# Patient Record
Sex: Male | Born: 1970 | Race: White | Hispanic: No | Marital: Married | Smoking: Former smoker
Health system: Southern US, Community
[De-identification: ages and names within clinical notes are randomized; demographics above are authoritative.]

## PROBLEM LIST (undated history)

## (undated) DIAGNOSIS — Z9889 Other specified postprocedural states: Secondary | ICD-10-CM

## (undated) DIAGNOSIS — E7800 Pure hypercholesterolemia, unspecified: Secondary | ICD-10-CM

## (undated) DIAGNOSIS — R112 Nausea with vomiting, unspecified: Secondary | ICD-10-CM

## (undated) HISTORY — DX: Pure hypercholesterolemia, unspecified: E78.00

---

## 2006-04-12 ENCOUNTER — Emergency Department: Payer: Self-pay | Admitting: Emergency Medicine

## 2009-02-02 ENCOUNTER — Emergency Department: Payer: Self-pay | Admitting: Emergency Medicine

## 2010-05-22 HISTORY — PX: FOOT SURGERY: SHX648

## 2011-01-10 ENCOUNTER — Inpatient Hospital Stay: Payer: Self-pay | Admitting: Internal Medicine

## 2011-02-07 ENCOUNTER — Other Ambulatory Visit: Payer: Self-pay | Admitting: Podiatry

## 2016-04-27 ENCOUNTER — Encounter: Payer: Self-pay | Admitting: Internal Medicine

## 2016-04-27 ENCOUNTER — Ambulatory Visit (INDEPENDENT_AMBULATORY_CARE_PROVIDER_SITE_OTHER): Payer: 59 | Admitting: Internal Medicine

## 2016-04-27 VITALS — BP 118/80 | HR 56 | Temp 97.8°F | Ht 75.0 in | Wt 196.0 lb

## 2016-04-27 DIAGNOSIS — Z0001 Encounter for general adult medical examination with abnormal findings: Secondary | ICD-10-CM | POA: Diagnosis not present

## 2016-04-27 DIAGNOSIS — M25541 Pain in joints of right hand: Secondary | ICD-10-CM | POA: Diagnosis not present

## 2016-04-27 LAB — COMPREHENSIVE METABOLIC PANEL
ALT: 27 U/L (ref 0–53)
AST: 24 U/L (ref 0–37)
Albumin: 4.6 g/dL (ref 3.5–5.2)
Alkaline Phosphatase: 68 U/L (ref 39–117)
BILIRUBIN TOTAL: 0.6 mg/dL (ref 0.2–1.2)
BUN: 16 mg/dL (ref 6–23)
CALCIUM: 9.7 mg/dL (ref 8.4–10.5)
CHLORIDE: 102 meq/L (ref 96–112)
CO2: 31 meq/L (ref 19–32)
Creatinine, Ser: 1.14 mg/dL (ref 0.40–1.50)
GFR: 72.95 mL/min (ref >60.00)
GLUCOSE: 93 mg/dL (ref 70–99)
POTASSIUM: 5.1 meq/L (ref 3.5–5.1)
Sodium: 138 mEq/L (ref 135–145)
Total Protein: 7.8 g/dL (ref 6.0–8.3)

## 2016-04-27 LAB — CBC
HEMATOCRIT: 43.2 % (ref 39.0–52.0)
HEMOGLOBIN: 14.4 g/dL (ref 13.0–17.0)
MCHC: 33.5 g/dL (ref 30.0–36.0)
MCV: 93.1 fl (ref 78.0–100.0)
PLATELETS: 264 10*3/uL (ref 150.0–400.0)
RBC: 4.64 Mil/uL (ref 4.22–5.81)
RDW: 12.7 % (ref 11.5–15.5)
WBC: 6 10*3/uL (ref 4.0–10.5)

## 2016-04-27 LAB — LIPID PANEL
CHOL/HDL RATIO: 4
Cholesterol: 228 mg/dL — ABNORMAL HIGH (ref 0–200)
HDL: 52.1 mg/dL (ref >39.00)
LDL Cholesterol: 160 mg/dL — ABNORMAL HIGH (ref 0–99)
NONHDL: 175.64
TRIGLYCERIDES: 77 mg/dL (ref 0.0–149.0)
VLDL: 15.4 mg/dL (ref 0.0–40.0)

## 2016-04-27 NOTE — Progress Notes (Signed)
HPI  Pt presents to the clinic today to establish care. He has not had a PCP in many years.  Flu: never Tetanus: 2012 Vision Screening: as needed Dentist: biannually  Diet: He does eat meat. He consumes fruits and veggies some days. He does eat fried foods. He drinks mostly soda. Exercise: None  No past medical history on file.  No current outpatient prescriptions on file.   No current facility-administered medications for this visit.     No Known Allergies  Family History  Problem Relation Age of Onset  . Stroke Maternal Grandmother   . Stroke Paternal Grandmother     Social History   Social History  . Marital status: Married    Spouse name: N/A  . Number of children: N/A  . Years of education: N/A   Occupational History  . Not on file.   Social History Main Topics  . Smoking status: Never Smoker  . Smokeless tobacco: Never Used  . Alcohol use 2.4 - 3.6 oz/week    4 - 6 Cans of beer per week     Comment: occasional--moderate  . Drug use: No  . Sexual activity: Not on file   Other Topics Concern  . Not on file   Social History Narrative  . No narrative on file    ROS:  Constitutional: Denies fever, malaise, fatigue, headache or abrupt weight changes.  HEENT: Denies eye pain, eye redness, ear pain, ringing in the ears, wax buildup, runny nose, nasal congestion, bloody nose, or sore throat. Respiratory: Denies difficulty breathing, shortness of breath, cough or sputum production.   Cardiovascular: Denies chest pain, chest tightness, palpitations or swelling in the hands or feet.  Gastrointestinal: Denies abdominal pain, bloating, constipation, diarrhea or blood in the stool.  GU: Denies frequency, urgency, pain with urination, blood in urine, odor or discharge. Musculoskeletal: Pt reports right thumb pain. Denies decrease in range of motion, difficulty with gait, muscle pain or jointswelling.  Skin: Denies redness, rashes, lesions or ulcercations.   Neurological: Denies dizziness, difficulty with memory, difficulty with speech or problems with balance and coordination.  Psych: Denies anxiety, depression, SI/HI.  No other specific complaints in a complete review of systems (except as listed in HPI above).  PE:  BP 118/80   Pulse (!) 56   Temp 97.8 F (36.6 C) (Oral)   Ht 6\' 3"  (1.905 m)   Wt 196 lb (88.9 kg)   SpO2 98%   BMI 24.50 kg/m  Wt Readings from Last 3 Encounters:  04/27/16 196 lb (88.9 kg)    General: Appears his stated age, well developed, well nourished in NAD. HEENT: Head: normal shape and size; Eyes: sclera white, no icterus, conjunctiva pink, PERRLA and EOMs intact; Ears: Tm's gray and intact, normal light reflex;Throat/Mouth: Teeth present, mucosa pink and moist, no lesions or ulcerations noted.  Neck: Neck supple, trachea midline. No masses, lumps or thyromegaly present.  Cardiovascular: Normal rate and rhythm. S1,S2 noted.  No murmur, rubs or gallops noted. No JVD or BLE edema. No carotid bruits noted. Pulmonary/Chest: Normal effort and positive vesicular breath sounds. No respiratory distress. No wheezes, rales or ronchi noted.  Abdomen: Soft and nontender. Normal bowel sounds, no bruits noted. No distention or masses noted. Liver, spleen and kidneys non palpable. Musculoskeletal: Normal flexion and extension of the right thumb. No signs of joint swelling. Right thumb MCP enlarged and tender with palpation. Strength 5/5 BLE. No difficulty with gait.  Neurological: Alert and oriented. Cranial nerves II-XII grossly  intact. Coordination normal.  Psychiatric: Mood and affect normal. Behavior is normal. Judgment and thought content normal.     Assessment and Plan:  Preventative Health Maintenance:  He declines flu shot today Tetanus UTD Encouraged him to consume a balanced diet and exercise regimen Advised him to see an eye doctor and dentist annually Will check CBC, CMET, Lipid today  Right thumb  pain:  Likely arthritis Advised him to take Aleve or Ibuprofen OTC  RTC in 1 year, sooner if needed Webb Silversmith, NP

## 2016-04-27 NOTE — Patient Instructions (Signed)

## 2016-10-13 DIAGNOSIS — M7702 Medial epicondylitis, left elbow: Secondary | ICD-10-CM | POA: Diagnosis not present

## 2017-11-15 ENCOUNTER — Encounter: Payer: Self-pay | Admitting: Family Medicine

## 2017-11-15 ENCOUNTER — Ambulatory Visit (INDEPENDENT_AMBULATORY_CARE_PROVIDER_SITE_OTHER): Payer: Self-pay | Admitting: Family Medicine

## 2017-11-15 VITALS — BP 110/80 | HR 74 | Temp 98.4°F | Ht 76.0 in | Wt 198.0 lb

## 2017-11-15 DIAGNOSIS — Z0000 Encounter for general adult medical examination without abnormal findings: Secondary | ICD-10-CM

## 2017-11-15 LAB — POCT URINALYSIS DIPSTICK
BILIRUBIN UA: NEGATIVE
Blood, UA: NEGATIVE
GLUCOSE UA: NEGATIVE
Ketones, UA: NEGATIVE
Leukocytes, UA: NEGATIVE
Nitrite, UA: NEGATIVE
Protein, UA: NEGATIVE
Spec Grav, UA: >=1.03 — AB (ref 1.010–1.025)
Urobilinogen, UA: 0.2 E.U./dL
pH, UA: 5 (ref 5.0–8.0)

## 2017-11-15 NOTE — Progress Notes (Signed)
Patient ID: Kristopher Osborne, male    DOB: 02/23/1971, 47 y.o.   MRN: [id number]  PCP: Jearld Fenton, NP  Chief Complaint  Patient presents with  . Wellness Visit    Subjective:  HPI  Kristopher Osborne is a 47 y.o. male presents for wellness visit in order to satisfy employee sponsored health insurance benefits. No recent follow-up with a primary care provider. He was last seen in 2017 for physical in which his cholesterol was elevated and his provider wanted to initiate statin therapy, although he declined. Admits to poor eating choices. He is a non-smoker. He works as an Database administrator and reports that he lives a very physically active lifestyle. He denies activity intolerance, shortness of breath, chest pain, dizziness, or new weakness.  Social History   Socioeconomic History  . Marital status: Married    Spouse name: Not on file  . Number of children: Not on file  . Years of education: Not on file  . Highest education level: Not on file  Occupational History  . Not on file  Social Needs  . Financial resource strain: Not on file  . Food insecurity:    Worry: Not on file    Inability: Not on file  . Transportation needs:    Medical: Not on file    Non-medical: Not on file  Tobacco Use  . Smoking status: Never Smoker  . Smokeless tobacco: Never Used  Substance and Sexual Activity  . Alcohol use: Yes    Alcohol/week: 2.4 - 3.6 oz    Types: 4 - 6 Cans of beer per week    Comment: occasional--moderate  . Drug use: No  . Sexual activity: Yes  Lifestyle  . Physical activity:    Days per week: Not on file    Minutes per session: Not on file  . Stress: Not on file  Relationships  . Social connections:    Talks on phone: Not on file    Gets together: Not on file    Attends religious service: Not on file    Active member of club or organization: Not on file    Attends meetings of clubs or organizations: Not on file    Relationship status: Not on file  .  Intimate partner violence:    Fear of current or ex partner: Not on file    Emotionally abused: Not on file    Physically abused: Not on file    Forced sexual activity: Not on file  Other Topics Concern  . Not on file  Social History Narrative  . Not on file    Family History  Problem Relation Age of Onset  . Stroke Maternal Grandmother   . Stroke Paternal Grandmother     Review of Systems Constitutional: Negative for fever, chills, diaphoresis, activity change, appetite change and fatigue. HENT: Negative for ear pain, nosebleeds, congestion, facial swelling, rhinorrhea, neck pain, neck stiffness and ear discharge.  Eyes: Negative for pain, discharge, redness, itching and visual disturbance. Respiratory: Negative for cough, choking, chest tightness, shortness of breath, wheezing and stridor.  Cardiovascular: Negative for chest pain, palpitations and leg swelling. Gastrointestinal: Negative for abdominal distention. Musculoskeletal: Negative for back pain, joint swelling, arthralgia and gait problem. Neurological: Negative for dizziness, tremors, seizures, syncope, facial asymmetry, speech difficulty, weakness, light-headedness, numbness and headaches.   No Known Allergies  Prior to Admission medications   Not on File   Past Medical, Surgical Family and Social History reviewed and updated.  Objective:   Today's Vitals   11/15/17 1058  BP: 110/80  Pulse: 74  Temp: 98.4 F (36.9 C)  SpO2: 96%  Weight: 198 lb (89.8 kg)  Height: 6\' 4"  (1.93 m)    Wt Readings from Last 3 Encounters:  11/15/17 198 lb (89.8 kg)  04/27/16 196 lb (88.9 kg)     Hearing Screening   125Hz  250Hz  500Hz  1000Hz  2000Hz  3000Hz  4000Hz  6000Hz  8000Hz   Right ear:   Pass Pass Pass  Fail    Left ear:   Pass Pass Pass  Fail      Visual Acuity Screening   Right eye Left eye Both eyes  Without correction: 20/20 20/25 20/20   With correction:      Physical Exam Constitutional: Patient appears  well-developed and well-nourished. No distress. HENT: Normocephalic, atraumatic, External right and left ear normal. Oropharynx is clear and moist.  Eyes: Conjunctivae and EOM are normal. PERRLA, no scleral icterus. Neck: Normal ROM. Neck supple. No JVD. No tracheal deviation. No thyromegaly. CVS: RRR, S1/S2 +, no murmurs, no gallops, no carotid bruit.  Pulmonary: Effort and breath sounds normal, no stridor, rhonchi, wheezes, rales.  Abdominal: Soft. BS +, no distension, tenderness, rebound or guarding.  Musculoskeletal: Normal range of motion. No edema and no tenderness.  Neuro: Alert. Normal reflexes, muscle tone coordination. No cranial nerve deficit. Skin: Skin is warm and dry. No rash noted. Not diaphoretic. No erythema. No pallor.   Assessment & Plan:  1. Wellness examination Continue routine physical activity as tolerated with a goal of 150 minutes per week. Improve dietary choices and eat a meal regimen consistent with a Mediterranean or DASH diet. Reduce simple carbohydrates.  Follow-up with PCP to obtain routine screening labs and repeat lipid panel. -POCT Urinalysis Dipstick, unremarkable   If symptoms worsen or do not improve, return for follow-up, follow-up with PCP, or at the emergency department if severity of symptoms warrant a higher level of care.     Carroll Sage. Kenton Kingfisher, MSN, FNP-C [facility]  [address] [place], [place] [address] [phone]

## 2017-11-15 NOTE — Patient Instructions (Addendum)
Follow-up with your PCP to have cholesterol rechecked and any other recommended labs.   Health Maintenance, Male A healthy lifestyle and preventive care is important for your health and wellness. Ask your health care provider about what schedule of regular examinations is right for you. What should I know about weight and diet? Eat a Healthy Diet  Eat plenty of vegetables, fruits, whole grains, low-fat dairy products, and lean protein.  Do not eat a lot of foods high in solid fats, added sugars, or salt.  Maintain a Healthy Weight Regular exercise can help you achieve or maintain a healthy weight. You should:  Do at least 150 minutes of exercise each week. The exercise should increase your heart rate and make you sweat (moderate-intensity exercise).  Do strength-training exercises at least twice a week.  Watch Your Levels of Cholesterol and Blood Lipids  Have your blood tested for lipids and cholesterol every 5 years starting at 49 years of age. If you are at high risk for heart disease, you should start having your blood tested when you are 49 years old. You may need to have your cholesterol levels checked more often if: ? Your lipid or cholesterol levels are high. ? You are older than 49 years of age. ? You are at high risk for heart disease.  What should I know about cancer screening? Many types of cancers can be detected early and may often be prevented. Lung Cancer  You should be screened every year for lung cancer if: ? You are a current smoker who has smoked for at least 30 years. ? You are a former smoker who has quit within the past 15 years.  Talk to your health care provider about your screening options, when you should start screening, and how often you should be screened.  Colorectal Cancer  Routine colorectal cancer screening usually begins at 49 years of age and should be repeated every 5-10 years until you are 49 years old. You may need to be screened more often  if early forms of precancerous polyps or small growths are found. Your health care provider may recommend screening at an earlier age if you have risk factors for colon cancer.  Your health care provider may recommend using home test kits to check for hidden blood in the stool.  A small camera at the end of a tube can be used to examine your colon (sigmoidoscopy or colonoscopy). This checks for the earliest forms of colorectal cancer.  Prostate and Testicular Cancer  Depending on your age and overall health, your health care provider may do certain tests to screen for prostate and testicular cancer.  Talk to your health care provider about any symptoms or concerns you have about testicular or prostate cancer.  Skin Cancer  Check your skin from head to toe regularly.  Tell your health care provider about any new moles or changes in moles, especially if: ? There is a change in a mole's size, shape, or color. ? You have a mole that is larger than a pencil eraser.  Always use sunscreen. Apply sunscreen liberally and repeat throughout the day.  Protect yourself by wearing long sleeves, pants, a wide-brimmed hat, and sunglasses when outside.  What should I know about heart disease, diabetes, and high blood pressure?  If you are 55-15 years of age, have your blood pressure checked every 3-5 years. If you are 88 years of age or older, have your blood pressure checked every year. You should have your  blood pressure measured twice-once when you are at a hospital or clinic, and once when you are not at a hospital or clinic. Record the average of the two measurements. To check your blood pressure when you are not at a hospital or clinic, you can use: ? An automated blood pressure machine at a pharmacy. ? A home blood pressure monitor.  Talk to your health care provider about your target blood pressure.  If you are between 87-20 years old, ask your health care provider if you should take aspirin  to prevent heart disease.  Have regular diabetes screenings by checking your fasting blood sugar level. ? If you are at a normal weight and have a low risk for diabetes, have this test once every three years after the age of 12. ? If you are overweight and have a high risk for diabetes, consider being tested at a younger age or more often.  A one-time screening for abdominal aortic aneurysm (AAA) by ultrasound is recommended for men aged 33-75 years who are current or former smokers. What should I know about preventing infection? Hepatitis B If you have a higher risk for hepatitis B, you should be screened for this virus. Talk with your health care provider to find out if you are at risk for hepatitis B infection. Hepatitis C Blood testing is recommended for:  Everyone born from [birth year] through 1967.  Anyone with known risk factors for hepatitis C.  Sexually Transmitted Diseases (STDs)  You should be screened each year for STDs including gonorrhea and chlamydia if: ? You are sexually active and are younger than 49 years of age. ? You are older than 49 years of age and your health care provider tells you that you are at risk for this type of infection. ? Your sexual activity has changed since you were last screened and you are at an increased risk for chlamydia or gonorrhea. Ask your health care provider if you are at risk.  Talk with your health care provider about whether you are at high risk of being infected with HIV. Your health care provider may recommend a prescription medicine to help prevent HIV infection.  What else can I do?  Schedule regular health, dental, and eye exams.  Stay current with your vaccines (immunizations).  Do not use any tobacco products, such as cigarettes, chewing tobacco, and e-cigarettes. If you need help quitting, ask your health care provider.  Limit alcohol intake to no more than 2 drinks per day. One drink equals 12 ounces of beer, 5 ounces of wine,  or 1 ounces of hard liquor.  Do not use street drugs.  Do not share needles.  Ask your health care provider for help if you need support or information about quitting drugs.  Tell your health care provider if you often feel depressed.  Tell your health care provider if you have ever been abused or do not feel safe at home. This information is not intended to replace advice given to you by your health care provider. Make sure you discuss any questions you have with your health care provider. Document Released: 11/04/2007 Document Revised: 01/05/2016 Document Reviewed: 02/09/2015 Elsevier Interactive Patient Education  2018 [employer].    Cholesterol Cholesterol is a fat. Your body needs a small amount of cholesterol. Cholesterol (plaque) may build up in your blood vessels (arteries). That makes you more likely to have a heart attack or stroke. You cannot feel your cholesterol level. Having a blood test is the  only way to find out if your level is high. Keep your test results. Work with your doctor to keep your cholesterol at a good level. What do the results mean? Total cholesterol is how much cholesterol is in your blood. LDL is bad cholesterol. This is the type that can build up. Try to have low LDL. HDL is good cholesterol. It cleans your blood vessels and carries LDL away. Try to have high HDL. Triglycerides are fat that the body can store or burn for energy. What are good levels of cholesterol? Total cholesterol below 200. LDL below 100 is good for people who have health risks. LDL below 70 is good for people who have very high risks. HDL above 40 is good. It is best to have HDL of 60 or higher. Triglycerides below 150. How can I lower my cholesterol? Diet Follow your diet program as told by your doctor. Choose fish, white meat chicken, or [place] that is roasted or baked. Try not to eat red meat, fried foods, sausage, or lunch meats. Eat lots of fresh fruits and  vegetables. Choose whole grains, beans, pasta, potatoes, and cereals. Choose olive oil, corn oil, or canola oil. Only use small amounts. Try not to eat butter, mayonnaise, shortening, or palm kernel oils. Try not to eat foods with trans fats. Choose low-fat or nonfat dairy foods. Drink skim or nonfat milk. Eat low-fat or nonfat yogurt and cheeses. Try not to drink whole milk or cream. Try not to eat ice cream, egg yolks, or full-fat cheeses. Healthy desserts include angel food cake, ginger snaps, animal crackers, hard candy, popsicles, and low-fat or nonfat frozen yogurt. Try not to eat pastries, cakes, pies, and cookies.  Exercise Follow your exercise program as told by your doctor. Be more active. Try gardening, walking, and taking the stairs. Ask your doctor about ways that you can be more active.  Medicine Take over-the-counter and prescription medicines only as told by your doctor. This information is not intended to replace advice given to you by your health care provider. Make sure you discuss any questions you have with your health care provider. Document Released: 08/04/2008 Document Revised: 12/08/2015 Document Reviewed: 11/18/2015 Elsevier Interactive Patient Education  [employer].

## 2017-12-19 ENCOUNTER — Encounter: Payer: Self-pay | Admitting: Family Medicine

## 2017-12-19 ENCOUNTER — Ambulatory Visit (INDEPENDENT_AMBULATORY_CARE_PROVIDER_SITE_OTHER): Payer: No Typology Code available for payment source | Admitting: Family Medicine

## 2017-12-19 VITALS — BP 104/68 | HR 63 | Temp 97.7°F | Resp 16 | Ht 76.0 in | Wt 200.3 lb

## 2017-12-19 DIAGNOSIS — R109 Unspecified abdominal pain: Secondary | ICD-10-CM

## 2017-12-19 DIAGNOSIS — E7800 Pure hypercholesterolemia, unspecified: Secondary | ICD-10-CM | POA: Diagnosis not present

## 2017-12-19 DIAGNOSIS — L989 Disorder of the skin and subcutaneous tissue, unspecified: Secondary | ICD-10-CM

## 2017-12-19 DIAGNOSIS — Z131 Encounter for screening for diabetes mellitus: Secondary | ICD-10-CM

## 2017-12-19 DIAGNOSIS — Z23 Encounter for immunization: Secondary | ICD-10-CM | POA: Diagnosis not present

## 2017-12-19 DIAGNOSIS — Z205 Contact with and (suspected) exposure to viral hepatitis: Secondary | ICD-10-CM | POA: Diagnosis not present

## 2017-12-19 DIAGNOSIS — E785 Hyperlipidemia, unspecified: Secondary | ICD-10-CM | POA: Insufficient documentation

## 2017-12-19 MED ORDER — NAPROXEN 500 MG PO TABS: 500.0000 mg | ORAL_TABLET | Freq: Two times a day (BID) | ORAL | 0 refills | Status: DC

## 2017-12-19 NOTE — Progress Notes (Signed)
Name: Kristopher Osborne   MRN: [id number]    DOB: 1970-09-15   Date:12/19/2017       Progress Note  Subjective  Chief Complaint  Chief Complaint  Patient presents with  . Establish Care    HPI  Hypercholesteremia: last labs done in 2017 and LDL was 160. He has family history of strokes but no early onset heart disease. He denies chest pain, palpitation, or elevated bp  Skin lesion: he is bald and has noticed a spot that is rough and he keeps scratching, he is bald and likes to be in the sun.   Left flank pain: he states he noticed left flank pain for the past 6 months, he states no weight change, no change in bowel movements, bristol scale of 3, no dysuria or hematuria. No history of kidney stones. Normal appetite.   Exposure to hepatitis C: we will check labs as requested    Patient Active Problem List   Diagnosis Date Noted  . Hyperlipidemia 12/19/2017    Past Surgical History:  Procedure Laterality Date  . FOOT SURGERY Left 2012    Family History  Problem Relation Age of Onset  . Hepatitis C Father   . Stroke Maternal Grandmother   . Stroke Paternal Grandmother     Social History   Socioeconomic History  . Marital status: Married    Spouse name: Helene Kelp   . Number of children: 1  . Years of education: Not on file  . Highest education level: GED or equivalent  Occupational History  . Occupation: Dealer   Social Needs  . Financial resource strain: Not hard at all  . Food insecurity:    Worry: Never true    Inability: Never true  . Transportation needs:    Medical: No    Non-medical: No  Tobacco Use  . Smoking status: Former Smoker    Packs/day: 0.50    Years: 7.00    Pack years: 3.50    Types: Cigarettes    Start date: 05/23/1987    Last attempt to quit: 12/20/1994    Years since quitting: 23.0  . Smokeless tobacco: Never Used  Substance and Sexual Activity  . Alcohol use: Yes    Alcohol/week: 2.4 - 3.6 oz    Types: 4 - 6 Cans of beer per week   Comment: occasional--moderate  . Drug use: No  . Sexual activity: Yes    Partners: Female  Lifestyle  . Physical activity:    Days per week: 2 days    Minutes per session: 60 min  . Stress: Not at all  Relationships  . Social connections:    Talks on phone: Three times a week    Gets together: Twice a week    Attends religious service: Never    Active member of club or organization: No    Attends meetings of clubs or organizations: Never    Relationship status: Married  . Intimate partner violence:    Fear of current or ex partner: No    Emotionally abused: No    Physically abused: No    Forced sexual activity: No  Other Topics Concern  . Not on file  Social History Narrative  . Not on file     Current Outpatient Medications:  .  calcium carbonate (OS-CAL - DOSED IN MG OF ELEMENTAL CALCIUM) 1250 (500 Ca) MG tablet, Take 1 tablet by mouth daily with breakfast., Disp: , Rfl:  .  vitamin B-12 (CYANOCOBALAMIN) 500 MCG tablet, Take  500 mcg by mouth daily., Disp: , Rfl:   No Known Allergies   ROS  Constitutional: Negative for fever or weight change.  Respiratory: Negative for cough and shortness of breath.   Cardiovascular: Negative for chest pain or palpitations.  Gastrointestinal: positive for abdominal pain, no bowel changes.  Musculoskeletal: Negative for gait problem or joint swelling.  Skin: Negative for rash.  Neurological: Negative for dizziness or headache.  No other specific complaints in a complete review of systems (except as listed in HPI above).  Objective  Vitals:   12/19/17 0849  BP: 104/68  Pulse: 63  Resp: 16  Temp: 97.7 F (36.5 C)  TempSrc: Oral  SpO2: 97%  Weight: 200 lb 4.8 oz (90.9 kg)  Height: 6\' 4"  (1.93 m)    Body mass index is 24.38 kg/m.  Physical Exam  Constitutional: Patient appears well-developed and well-nourished.  No distress.  HEENT: head atraumatic, normocephalic, pupils equal and reactive to light,  neck supple, throat  within normal limits Cardiovascular: Normal rate, regular rhythm and normal heart sounds.  No murmur heard. No BLE edema. Pulmonary/Chest: Effort normal and breath sounds normal. No respiratory distress. Abdominal: Soft.  There is no tenderness. Negative CVA tenderness, spleen normal, normal bowel sounds  Muscular Skeletal: normal rom of spine, no pain during palpation of his back.  Psychiatric: Patient has a normal mood and affect. behavior is normal. Judgment and thought content normal.  Recent Results (from the past 2160 hour(s))  POCT Urinalysis Dipstick     Status: Abnormal   Collection Time: 11/15/17 11:14 AM  Result Value Ref Range   Color, UA yellow    Clarity, UA clear    Glucose, UA Negative Negative   Bilirubin, UA neg    Ketones, UA neg    Spec Grav, UA >=1.030 (A) 1.010 - 1.025   Blood, UA neg    pH, UA 5.0 5.0 - 8.0   Protein, UA Negative Negative   Urobilinogen, UA 0.2 0.2 or 1.0 E.U./dL   Nitrite, UA neg    Leukocytes, UA Negative Negative   Appearance     Odor       PHQ2/9: Depression screen [facility] 2/9 12/19/2017 04/27/2016  Decreased Interest 0 0  Down, Depressed, Hopeless 0 0  PHQ - 2 Score 0 0  Altered sleeping 0 -  Tired, decreased energy 0 -  Change in appetite 0 -  Feeling bad or failure about yourself  0 -  Trouble concentrating 0 -  Moving slowly or fidgety/restless 0 -  Suicidal thoughts 0 -  PHQ-9 Score 0 -  Difficult doing work/chores Not difficult at all -     Fall Risk: Fall Risk  12/19/2017 04/27/2016  Falls in the past year? No No     Functional Status Survey: Is the patient deaf or have difficulty hearing?: No Does the patient have difficulty seeing, even when wearing glasses/contacts?: No Does the patient have difficulty concentrating, remembering, or making decisions?: No Does the patient have difficulty walking or climbing stairs?: No Does the patient have difficulty dressing or bathing?: No Does the patient have difficulty doing  errands alone such as visiting a doctor's office or shopping?: No    Assessment & Plan  1. Pure hypercholesterolemia  - Lipid panel - EKG 12-Lead  2. Need for Tdap vaccination  - Tdap vaccine greater than or equal to 7yo IM  3. Diabetes mellitus screening  - COMPLETE METABOLIC PANEL WITH GFR - Hemoglobin A1c  4. Exposure to  hepatitis C  - COMPLETE METABOLIC PANEL WITH GFR - CBC with Differential/Platelet - Hepatitis panel, acute  5. Skin lesion of scalp  - Ambulatory referral to Dermatology  6. Left flank pain  Seems to be muscular, advised nsaid's for one week and stop

## 2017-12-20 LAB — CBC WITH DIFFERENTIAL/PLATELET
BASOS PCT: 0.5 %
Basophils Absolute: 28 cells/uL (ref 0–200)
EOS PCT: 6.1 %
Eosinophils Absolute: 342 cells/uL (ref 15–500)
HCT: 39.5 % (ref 38.5–50.0)
HEMOGLOBIN: 13.5 g/dL (ref 13.2–17.1)
Lymphs Abs: 1837 cells/uL (ref 850–3900)
MCH: 31.6 pg (ref 27.0–33.0)
MCHC: 34.2 g/dL (ref 32.0–36.0)
MCV: 92.5 fL (ref 80.0–100.0)
MONOS PCT: 6.8 %
MPV: 10 fL (ref 7.5–12.5)
NEUTROS ABS: 3013 {cells}/uL (ref 1500–7800)
Neutrophils Relative %: 53.8 %
PLATELETS: 287 10*3/uL (ref 140–400)
RBC: 4.27 10*6/uL (ref 4.20–5.80)
RDW: 12.1 % (ref 11.0–15.0)
TOTAL LYMPHOCYTE: 32.8 %
WBC mixed population: 381 cells/uL (ref 200–950)
WBC: 5.6 10*3/uL (ref 3.8–10.8)

## 2017-12-20 LAB — LIPID PANEL
CHOL/HDL RATIO: 5.7 (calc) — AB (ref <5.0)
Cholesterol: 234 mg/dL — ABNORMAL HIGH (ref <200)
HDL: 41 mg/dL (ref >40)
LDL CHOLESTEROL (CALC): 147 mg/dL — AB
NON-HDL CHOLESTEROL (CALC): 193 mg/dL — AB (ref <130)
Triglycerides: 299 mg/dL — ABNORMAL HIGH (ref <150)

## 2017-12-20 LAB — HEPATITIS PANEL, ACUTE
HEP B C IGM: NONREACTIVE
HEP B S AG: NONREACTIVE
HEP C AB: NONREACTIVE
Hep A IgM: NONREACTIVE
SIGNAL TO CUT-OFF: 0.01 (ref <1.00)

## 2017-12-20 LAB — COMPLETE METABOLIC PANEL WITH GFR
AG RATIO: 1.6 (calc) (ref 1.0–2.5)
ALKALINE PHOSPHATASE (APISO): 160 U/L — AB (ref 40–115)
ALT: 41 U/L (ref 9–46)
AST: 21 U/L (ref 10–40)
Albumin: 4.2 g/dL (ref 3.6–5.1)
BILIRUBIN TOTAL: 0.3 mg/dL (ref 0.2–1.2)
BUN: 19 mg/dL (ref 7–25)
CALCIUM: 9.1 mg/dL (ref 8.6–10.3)
CHLORIDE: 105 mmol/L (ref 98–110)
CO2: 30 mmol/L (ref 20–32)
Creat: 1.12 mg/dL (ref 0.60–1.35)
GFR, EST NON AFRICAN AMERICAN: 77 mL/min/{1.73_m2} (ref >=60)
GFR, Est African American: 89 mL/min/{1.73_m2} (ref >=60)
Globulin: 2.7 g/dL (calc) (ref 1.9–3.7)
Glucose, Bld: 64 mg/dL — ABNORMAL LOW (ref 65–139)
POTASSIUM: 4.5 mmol/L (ref 3.5–5.3)
Sodium: 140 mmol/L (ref 135–146)
Total Protein: 6.9 g/dL (ref 6.1–8.1)

## 2017-12-20 LAB — HEMOGLOBIN A1C
EAG (MMOL/L): 6.6 (calc)
HEMOGLOBIN A1C: 5.8 %{Hb} — AB (ref <5.7)
Mean Plasma Glucose: 120 (calc)

## 2017-12-22 ENCOUNTER — Other Ambulatory Visit: Payer: Self-pay | Admitting: Family Medicine

## 2017-12-22 DIAGNOSIS — R748 Abnormal levels of other serum enzymes: Secondary | ICD-10-CM

## 2017-12-24 ENCOUNTER — Other Ambulatory Visit: Payer: Self-pay

## 2017-12-24 DIAGNOSIS — R748 Abnormal levels of other serum enzymes: Secondary | ICD-10-CM

## 2018-01-07 ENCOUNTER — Encounter: Payer: Self-pay | Admitting: Family Medicine

## 2018-01-18 LAB — HEPATIC FUNCTION PANEL
AG Ratio: 1.5 (calc) (ref 1.0–2.5)
ALT: 41 U/L (ref 9–46)
AST: 24 U/L (ref 10–40)
Albumin: 4.1 g/dL (ref 3.6–5.1)
Alkaline phosphatase (APISO): 105 U/L (ref 40–115)
BILIRUBIN DIRECT: 0.1 mg/dL (ref 0.0–0.2)
BILIRUBIN TOTAL: 0.5 mg/dL (ref 0.2–1.2)
Globulin: 2.7 g/dL (calc) (ref 1.9–3.7)
Indirect Bilirubin: 0.4 mg/dL (calc) (ref 0.2–1.2)
Total Protein: 6.8 g/dL (ref 6.1–8.1)

## 2018-01-18 LAB — PSA: PSA: 0.4 ng/mL (ref <=4.0)

## 2018-01-23 DIAGNOSIS — L570 Actinic keratosis: Secondary | ICD-10-CM | POA: Insufficient documentation

## 2018-06-21 ENCOUNTER — Encounter: Payer: Self-pay | Admitting: Family Medicine

## 2018-06-21 ENCOUNTER — Ambulatory Visit (INDEPENDENT_AMBULATORY_CARE_PROVIDER_SITE_OTHER): Payer: No Typology Code available for payment source | Admitting: Family Medicine

## 2018-06-21 VITALS — BP 108/80 | HR 72 | Temp 97.6°F | Resp 16 | Ht 76.0 in | Wt 210.8 lb

## 2018-06-21 DIAGNOSIS — R7303 Prediabetes: Secondary | ICD-10-CM

## 2018-06-21 DIAGNOSIS — Z23 Encounter for immunization: Secondary | ICD-10-CM

## 2018-06-21 DIAGNOSIS — E7800 Pure hypercholesterolemia, unspecified: Secondary | ICD-10-CM | POA: Diagnosis not present

## 2018-06-21 DIAGNOSIS — C4490 Unspecified malignant neoplasm of skin, unspecified: Secondary | ICD-10-CM | POA: Diagnosis not present

## 2018-06-21 DIAGNOSIS — R748 Abnormal levels of other serum enzymes: Secondary | ICD-10-CM

## 2018-06-21 NOTE — Progress Notes (Signed)
Name: Kristopher Osborne   MRN: [id number]    DOB: [birth year]   Date:06/21/2018       Progress Note  Subjective  Chief Complaint  Chief Complaint  Patient presents with  . Hyperlipidemia    HPI  Dyslipidemia: he has changed his diet, avoiding fried food eating more fruit and vegetables.  The 10-year ASCVD risk score Kristopher Osborne., et al., 2013) is: 3.6%   Values used to calculate the score:     Age: 48 years     Sex: Male     Is Non-Hispanic African American: No     Diabetic: No     Tobacco smoker: No     Systolic Blood Pressure: 751 mmHg     Is BP treated: No     HDL Cholesterol: 41 mg/dL     Total Cholesterol: 234 mg/dL   Pre-diabetes: his last hgbA1C 5.8%, he denies polyphagia, polydipsia or polyuria.   Skin cancer: seen by Dermatologist and had it frozen, [place] and has to [place]  Elevated alkaline phosphatase: resolved when repeated last year, flank pain resolved. No change in bowel movements   Patient Active Problem List   Diagnosis Date Noted  . Skin cancer 06/21/2018  . Actinic keratosis 01/23/2018  . Hyperlipidemia 12/19/2017    Past Surgical History:  Procedure Laterality Date  . FOOT SURGERY Left 2012    Family History  Problem Relation Age of Onset  . Hepatitis C Father   . Stroke Maternal Grandmother   . Stroke Paternal Grandmother     Social History   Socioeconomic History  . Marital status: Married    Spouse name: Helene Kelp   . Number of children: 1  . Years of education: Not on file  . Highest education level: GED or equivalent  Occupational History  . Occupation: Dealer   Social Needs  . Financial resource strain: Not hard at all  . Food insecurity:    Worry: Never true    Inability: Never true  . Transportation needs:    Medical: No    Non-medical: No  Tobacco Use  . Smoking status: Former Smoker    Packs/day: 0.50    Years: 7.00    Pack years: 3.50    Types: Cigarettes    Start date: 05/23/1987    Last  attempt to quit: 12/20/1994    Years since quitting: 23.5  . Smokeless tobacco: Never Used  Substance and Sexual Activity  . Alcohol use: Yes    Alcohol/week: 4.0 - 6.0 standard drinks    Types: 4 - 6 Cans of beer per week    Comment: occasional--moderate  . Drug use: No  . Sexual activity: Yes    Partners: Female  Lifestyle  . Physical activity:    Days per week: 2 days    Minutes per session: 60 min  . Stress: Not at all  Relationships  . Social connections:    Talks on phone: Three times a week    Gets together: Twice a week    Attends religious service: Never    Active member of club or organization: No    Attends meetings of clubs or organizations: Never    Relationship status: Married  . Intimate partner violence:    Fear of current or ex partner: No    Emotionally abused: No    Physically abused: No    Forced sexual activity: No  Other Topics Concern  . Not on file  Social History Narrative  . Not on file     Current Outpatient Medications:  Marland Kitchen  Multiple Vitamin (MULTIVITAMIN) tablet, Take 1 tablet by mouth daily., Disp: , Rfl:   No Known Allergies  I personally reviewed active problem list, medication list, allergies, family history, social history with the patient/caregiver today.   ROS  Constitutional: Negative for fever, positive for  weight change.  Respiratory: Negative for cough and shortness of breath.   Cardiovascular: Negative for chest pain or palpitations.  Gastrointestinal: Negative for abdominal pain, no bowel changes.  Musculoskeletal: Negative for gait problem or joint swelling.  Skin: Negative for rash.  Neurological: Negative for dizziness or headache.  No other specific complaints in a complete review of systems (except as listed in HPI above).  Objective  Vitals:   06/21/18 0815  BP: 108/80  Pulse: 72  Resp: 16  Temp: 97.6 F (36.4 C)  TempSrc: Oral  SpO2: 98%  Weight: 210 lb 12.8 oz (95.6 kg)  Height: 6\' 4"  (1.93 m)     Body mass index is 25.66 kg/m.  Physical Exam  Constitutional: Patient appears well-developed and well-nourished. Obese  No distress.  HEENT: head atraumatic, normocephalic, pupils equal and reactive to light, neck supple, throat within normal limits Cardiovascular: Normal rate, regular rhythm and normal heart sounds.  No murmur heard. No BLE edema. Pulmonary/Chest: Effort normal and breath sounds normal. No respiratory distress. Abdominal: Soft.  There is no tenderness. Psychiatric: Patient has a normal mood and affect. behavior is normal. Judgment and thought content normal.   PHQ2/9: Depression screen [facility] 2/9 06/21/2018 12/19/2017 04/27/2016  Decreased Interest 0 0 0  Down, Depressed, Hopeless 0 0 0  PHQ - 2 Score 0 0 0  Altered sleeping - 0 -  Tired, decreased energy - 0 -  Change in appetite - 0 -  Feeling bad or failure about yourself  - 0 -  Trouble concentrating - 0 -  Moving slowly or fidgety/restless - 0 -  Suicidal thoughts - 0 -  PHQ-9 Score - 0 -  Difficult doing work/chores - Not difficult at all -     Fall Risk: Fall Risk  06/21/2018 12/19/2017 04/27/2016  Falls in the past year? 0 No No  Number falls in past yr: 0 - -  Injury with Fall? 0 - -     Assessment & Plan  1. Pure hypercholesterolemia  - Lipid panel  2. Flu vaccine need  refused  3. Skin cancer  Keep follow up with dermatologist   4. Pre-diabetes  - Hemoglobin A1c  5. Elevated alkaline phosphatase level  - COMPLETE METABOLIC PANEL WITH GFR

## 2018-06-22 LAB — COMPLETE METABOLIC PANEL WITH GFR
AG Ratio: 1.7 (calc) (ref 1.0–2.5)
ALT: 31 U/L (ref 9–46)
AST: 21 U/L (ref 10–40)
Albumin: 4.3 g/dL (ref 3.6–5.1)
Alkaline phosphatase (APISO): 91 U/L (ref 40–115)
BUN: 17 mg/dL (ref 7–25)
CO2: 26 mmol/L (ref 20–32)
Calcium: 9.4 mg/dL (ref 8.6–10.3)
Chloride: 106 mmol/L (ref 98–110)
Creat: 1.16 mg/dL (ref 0.60–1.35)
GFR, Est African American: 85 mL/min/{1.73_m2} (ref >=60)
GFR, Est Non African American: 74 mL/min/{1.73_m2} (ref >=60)
GLUCOSE: 88 mg/dL (ref 65–99)
Globulin: 2.6 g/dL (calc) (ref 1.9–3.7)
Potassium: 4.7 mmol/L (ref 3.5–5.3)
Sodium: 141 mmol/L (ref 135–146)
Total Bilirubin: 0.3 mg/dL (ref 0.2–1.2)
Total Protein: 6.9 g/dL (ref 6.1–8.1)

## 2018-06-22 LAB — HEMOGLOBIN A1C
Hgb A1c MFr Bld: 5.9 % of total Hgb — ABNORMAL HIGH (ref <5.7)
Mean Plasma Glucose: 123 (calc)
eAG (mmol/L): 6.8 (calc)

## 2018-06-22 LAB — LIPID PANEL
Cholesterol: 254 mg/dL — ABNORMAL HIGH (ref <200)
HDL: 41 mg/dL (ref >40)
LDL Cholesterol (Calc): 169 mg/dL (calc) — ABNORMAL HIGH
Non-HDL Cholesterol (Calc): 213 mg/dL (calc) — ABNORMAL HIGH (ref <130)
Total CHOL/HDL Ratio: 6.2 (calc) — ABNORMAL HIGH (ref <5.0)
Triglycerides: 269 mg/dL — ABNORMAL HIGH (ref <150)

## 2018-07-21 ENCOUNTER — Encounter: Payer: Self-pay | Admitting: Family Medicine

## 2018-12-20 ENCOUNTER — Encounter: Payer: No Typology Code available for payment source | Admitting: Family Medicine

## 2018-12-22 ENCOUNTER — Encounter: Payer: Self-pay | Admitting: Emergency Medicine

## 2018-12-22 ENCOUNTER — Emergency Department
Admission: EM | Admit: 2018-12-22 | Discharge: 2018-12-23 | Disposition: A | Discharge status: Home / Self Care | Payer: No Typology Code available for payment source | Attending: Emergency Medicine | Admitting: Emergency Medicine

## 2018-12-22 ENCOUNTER — Other Ambulatory Visit: Payer: Self-pay

## 2018-12-22 ENCOUNTER — Emergency Department: Payer: No Typology Code available for payment source

## 2018-12-22 DIAGNOSIS — Z87891 Personal history of nicotine dependence: Secondary | ICD-10-CM | POA: Insufficient documentation

## 2018-12-22 DIAGNOSIS — N201 Calculus of ureter: Secondary | ICD-10-CM | POA: Diagnosis not present

## 2018-12-22 DIAGNOSIS — Z79899 Other long term (current) drug therapy: Secondary | ICD-10-CM | POA: Diagnosis not present

## 2018-12-22 DIAGNOSIS — N200 Calculus of kidney: Secondary | ICD-10-CM

## 2018-12-22 DIAGNOSIS — R1032 Left lower quadrant pain: Secondary | ICD-10-CM | POA: Diagnosis present

## 2018-12-22 LAB — COMPREHENSIVE METABOLIC PANEL
ALT: 27 U/L (ref 0–44)
AST: 26 U/L (ref 15–41)
Albumin: 4.2 g/dL (ref 3.5–5.0)
Alkaline Phosphatase: 96 U/L (ref 38–126)
Anion gap: 10 (ref 5–15)
BUN: 20 mg/dL (ref 6–20)
CO2: 24 mmol/L (ref 22–32)
Calcium: 8.8 mg/dL — ABNORMAL LOW (ref 8.9–10.3)
Chloride: 105 mmol/L (ref 98–111)
Creatinine, Ser: 1.17 mg/dL (ref 0.61–1.24)
GFR calc Af Amer: >60 mL/min (ref >60)
GFR calc non Af Amer: >60 mL/min (ref >60)
Glucose, Bld: 137 mg/dL — ABNORMAL HIGH (ref 70–99)
Potassium: 3.4 mmol/L — ABNORMAL LOW (ref 3.5–5.1)
Sodium: 139 mmol/L (ref 135–145)
Total Bilirubin: 0.5 mg/dL (ref 0.3–1.2)
Total Protein: 7.5 g/dL (ref 6.5–8.1)

## 2018-12-22 LAB — CBC
HCT: 40 % (ref 39.0–52.0)
Hemoglobin: 13 g/dL (ref 13.0–17.0)
MCH: 31.2 pg (ref 26.0–34.0)
MCHC: 32.5 g/dL (ref 30.0–36.0)
MCV: 95.9 fL (ref 80.0–100.0)
Platelets: 280 10*3/uL (ref 150–400)
RBC: 4.17 MIL/uL — ABNORMAL LOW (ref 4.22–5.81)
RDW: 12.5 % (ref 11.5–15.5)
WBC: 9.7 10*3/uL (ref 4.0–10.5)
nRBC: 0 % (ref 0.0–0.2)

## 2018-12-22 LAB — LIPASE, BLOOD: Lipase: 30 U/L (ref 11–51)

## 2018-12-22 MED ORDER — FENTANYL CITRATE (PF) 100 MCG/2ML IJ SOLN
50.0000 ug | INTRAMUSCULAR | Status: DC | PRN
  Administered 2018-12-22: 50 ug via INTRAVENOUS
  Filled 2018-12-22: qty 2

## 2018-12-22 MED ORDER — HYDROMORPHONE HCL 1 MG/ML IJ SOLN
1.0000 mg | INTRAMUSCULAR | Status: AC
  Administered 2018-12-22: 1 mg via INTRAVENOUS
  Filled 2018-12-22: qty 1

## 2018-12-22 MED ORDER — KETOROLAC TROMETHAMINE 30 MG/ML IJ SOLN
15.0000 mg | Freq: Once | INTRAMUSCULAR | Status: AC
  Administered 2018-12-22: 15 mg via INTRAVENOUS
  Filled 2018-12-22: qty 1

## 2018-12-22 MED ORDER — ONDANSETRON HCL 4 MG/2ML IJ SOLN
4.0000 mg | Freq: Once | INTRAMUSCULAR | Status: AC | PRN
  Administered 2018-12-22: 4 mg via INTRAVENOUS
  Filled 2018-12-22: qty 2

## 2018-12-22 NOTE — ED Triage Notes (Signed)
Patient with complaint of left side flank pain with nausea that started about 30 minutes ago. Patient denies any urinary symptoms.

## 2018-12-22 NOTE — ED Notes (Signed)
Patient actively vomiting in triage.

## 2018-12-22 NOTE — ED Provider Notes (Signed)
[facility] Emergency Department Provider Note  ____________________________________________   First MD Initiated Contact with Patient 12/22/18 2315     (approximate)  I have reviewed the triage vital signs and the nursing notes.   HISTORY  Chief Complaint Flank Pain    HPI Kristopher Osborne is a 48 y.o. male with no contributory past medical history who presents for evaluation of acute onset and severe left flank pain associated with nausea and vomiting.  He reports it feels like a sharp and stabbing pain that radiates throughout the left side of his body but seems to be coming from the left flank.  He has had no urinary symptoms although he was not able to urinate much just recently when he went to the bathroom.  He has had at least one episode of vomiting and some associated nausea.  He cannot find a position of comfort and is very anxious and pacing around the room trying to find a position that makes him feel better.  He denies any recent injury.  He denies fever, sore throat, chest pain, cough, shortness of breath, dysuria, and lower abdominal pain other than the pain that is radiating from his left flank.  He has no history of kidney stones.  The pain is severe and nothing is making it better.         Past Medical History:  Diagnosis Date   Hypercholesteremia     Patient Active Problem List   Diagnosis Date Noted   Actinic keratosis 01/23/2018   Hyperlipidemia 12/19/2017    Past Surgical History:  Procedure Laterality Date   FOOT SURGERY Left 2012    Prior to Admission medications   Medication Sig Start Date End Date Taking? Authorizing Provider  docusate sodium (COLACE) 100 MG capsule Take 1 tablet once or twice daily as needed for constipation while taking narcotic pain medicine 12/23/18   Hinda Kehr, MD  Multiple Vitamin (MULTIVITAMIN) tablet Take 1 tablet by mouth daily.    [provider]  ondansetron (ZOFRAN ODT) 4 MG  disintegrating tablet Allow 1-2 tablets to dissolve in your mouth every 8 hours as needed for nausea/vomiting 12/23/18   Hinda Kehr, MD  oxyCODONE-acetaminophen (PERCOCET) 5-325 MG tablet Take 2 tablets by mouth every 6 (six) hours as needed for severe pain. 12/23/18   Hinda Kehr, MD  tamsulosin (FLOMAX) 0.4 MG CAPS capsule Take 1 tablet by mouth daily until you pass the kidney stone or no longer have symptoms 12/23/18   Hinda Kehr, MD    Allergies Patient has no known allergies.  Family History  Problem Relation Age of Onset   Hepatitis C Father    Stroke Maternal Grandmother    Stroke Paternal Grandmother     Social History Social History   Tobacco Use   Smoking status: Former Smoker    Packs/day: 0.50    Years: 7.00    Pack years: 3.50    Types: Cigarettes    Start date: 05/23/1987    Quit date: 12/20/1994    Years since quitting: 24.0   Smokeless tobacco: Never Used  Substance Use Topics   Alcohol use: Yes   Drug use: No    Review of Systems Constitutional: No fever/chills Eyes: No visual changes. ENT: No sore throat. Cardiovascular: Denies chest pain. Respiratory: Denies shortness of breath. Gastrointestinal: Severe pain in left flank radiating throughout the left side of his abdomen with associated nausea and vomiting. Genitourinary: Negative for dysuria. Musculoskeletal: Left flank pain. Integumentary: Negative  for rash. Neurological: Negative for headaches, focal weakness or numbness.  ____________________________________________   PHYSICAL EXAM:  VITAL SIGNS: ED Triage Vitals  Enc Vitals Group     BP 12/22/18 2153 (!) 149/84     Pulse Rate 12/22/18 2153 (!) 53     Resp 12/22/18 2153 18     Temp 12/22/18 2153 97.7 F (36.5 C)     Temp src --      SpO2 12/22/18 2153 98 %     Weight 12/22/18 2154 90.7 kg (200 lb)     Height 12/22/18 2154 1.905 m (6\' 3" )     Head Circumference --      Peak Flow --      Pain Score 12/22/18 2154 10     Pain  Loc --      Pain Edu? --      Excl. in [place]? --     Constitutional: Alert and oriented.  Appears extremely uncomfortable but nontoxic. Eyes: Conjunctivae are normal.  Head: Atraumatic. Nose: No congestion/rhinnorhea. Mouth/Throat: Mucous membranes are moist. Neck: No stridor.  No meningeal signs.   Cardiovascular: Normal rate, regular rhythm. Good peripheral circulation. Grossly normal heart sounds. Respiratory: Normal respiratory effort.  No retractions. No audible wheezing. Gastrointestinal: Soft and nontender. No distention.  Musculoskeletal: Left CVA tenderness to percussion.  No gross abnormalities associated with his extremities. Neurologic:  Normal speech and language. No gross focal neurologic deficits are appreciated.  Skin:  Skin is warm, dry and intact. No rash noted. Psychiatric: Mood and affect are normal other than being in pain.   ____________________________________________   LABS (all labs ordered are listed, but only abnormal results are displayed)  Labs Reviewed  COMPREHENSIVE METABOLIC PANEL - Abnormal; Notable for the following components:      Result Value   Potassium 3.4 (*)    Glucose, Bld 137 (*)    Calcium 8.8 (*)    All other components within normal limits  CBC - Abnormal; Notable for the following components:   RBC 4.17 (*)    All other components within normal limits  URINALYSIS, COMPLETE (UACMP) WITH MICROSCOPIC - Abnormal; Notable for the following components:   Color, Urine YELLOW (*)    APPearance CLEAR (*)    All other components within normal limits  LIPASE, BLOOD   ____________________________________________  EKG  None - EKG not ordered by ED physician ____________________________________________  RADIOLOGY   ED MD interpretation:  3-mm left obstructive ureteral stone  Official radiology report(s): Ct Renal Stone Study  Result Date: 12/22/2018 CLINICAL DATA:  Left-sided flank pain EXAM: CT ABDOMEN AND PELVIS WITHOUT CONTRAST  TECHNIQUE: Multidetector CT imaging of the abdomen and pelvis was performed following the standard protocol without IV contrast. COMPARISON:  None. FINDINGS: Lower chest: No acute abnormality. Hepatobiliary: Liver is within normal limits. The gallbladder is well distended with scattered small stones and dependent density likely representing tumefactive sludge. No pericholecystic fluid is noted. Pancreas: Unremarkable. No pancreatic ductal dilatation or surrounding inflammatory changes. Spleen: Normal in size without focal abnormality. Adrenals/Urinary Tract: Adrenal glands are within normal limits. Kidneys are well visualized bilaterally. Right kidney demonstrates no renal calculi although a small renal cyst is noted. Mild hydronephrosis on the left is seen with perinephric stranding. This extends inferiorly to the mid ureter where a 3 mm stone is noted. The more distal left ureter is within normal limits. The bladder is unremarkable. Stomach/Bowel: Scattered diverticular change is noted. The appendix is within normal limits. Small bowel is unremarkable. No  gastric abnormality is seen. Vascular/Lymphatic: Aortic atherosclerosis. No enlarged abdominal or pelvic lymph nodes. Reproductive: Prostate is unremarkable. Other: No abdominal wall hernia or abnormality. No abdominopelvic ascites. Musculoskeletal: No acute or significant osseous findings. IMPRESSION: 3 mm left mid ureteral stone with obstructive change. Cholelithiasis and tumefactive sludge. Diverticulosis without diverticulitis. Electronically Signed   By: Inez Catalina M.D.   On: 12/22/2018 23:56    ____________________________________________   PROCEDURES   Procedure(s) performed (including Critical Care):  Procedures   ____________________________________________   INITIAL IMPRESSION / MDM / Gate / ED COURSE  As part of my medical decision making, I reviewed the following data within the [facility]  notes reviewed and incorporated, Labs reviewed , Old chart reviewed, Notes from prior ED visits and [place] Controlled Substance Database   Differential diagnosis includes, but is not limited to, ureteral/renal colic, UTI/pyelonephritis, renal ischemia, musculoskeletal pain/strain.  The patient is in severe distress that I think is almost certainly the result of a ureteral stone.  His vital signs are stable and within normal limits and he is afebrile.  No known contacts with COVID-19 positive patients in no respiratory symptoms.  I have ordered Dilaudid 1 mg IV, Toradol 15 mg IV, and he already received Zofran 4 mg IV and fentanyl 50 mcg IV while he was in the waiting room.  I have ordered a CT renal stone protocol.  The comprehensive metabolic panel is reassuring and without acute abnormality and his CBC is also reassuring.  Normal lipase.  Urinalysis has not yet been collected.  The patient understands and agrees with the plan for evaluation and work-up.      Clinical Course as of Dec 23 310  Mon Dec 23, 2018  0000 3 mm obstructive stone in the mid ureter on the left.  I am ordering a lidocaine 1.5 mg/kg as per the renal colic protocol as well to assist with pain control and possibly smooth muscle relaxation.  CT Renal Laren Everts [CF]  0222 Patient feels much better reports that his pain is almost completely gone.  I went over with him and his wife my usual customary management recommendations and return precautions for kidney stones.  I am awaiting the results of his urinalysis but in the absence of any acute infection I anticipate discharge with medications as listed below and urology follow-up as needed.  He understands and agrees with the plan.   [CF]  0310 No evidence of UTI.  Discharging as per previous plan.  Urinalysis, Complete w Microscopic(!) [CF]    Clinical Course User Index [CF] Hinda Kehr, MD     ____________________________________________  FINAL CLINICAL IMPRESSION(S) / ED  DIAGNOSES  Final diagnoses:  Left ureteral stone  Kidney stone     MEDICATIONS GIVEN DURING THIS VISIT:  Medications  fentaNYL (SUBLIMAZE) injection 50 mcg (50 mcg Intravenous Given 12/22/18 2206)  ondansetron (ZOFRAN) injection 4 mg (4 mg Intravenous Given 12/22/18 2207)  HYDROmorphone (DILAUDID) injection 1 mg (1 mg Intravenous Given 12/22/18 2333)  ketorolac (TORADOL) 30 MG/ML injection 15 mg (15 mg Intravenous Given 12/22/18 2334)  lidocaine (XYLOCAINE) 136 mg in sodium chloride 0.9 % 100 mL IVPB (0 mg/kg  90.7 kg Intravenous Stopped 12/23/18 0118)     ED Discharge Orders         Ordered    oxyCODONE-acetaminophen (PERCOCET) 5-325 MG tablet  Every 6 hours PRN     12/23/18 0226    ondansetron (ZOFRAN ODT) 4 MG disintegrating tablet  12/23/18 0226    tamsulosin (FLOMAX) 0.4 MG CAPS capsule     12/23/18 0226    docusate sodium (COLACE) 100 MG capsule     12/23/18 0226          *Please note:  Kristopher Osborne was evaluated in Emergency Department on 12/23/2018 for the symptoms described in the history of present illness. He was evaluated in the context of the global COVID-19 pandemic, which necessitated consideration that the patient might be at risk for infection with the SARS-CoV-2 virus that causes COVID-19. Institutional protocols and algorithms that pertain to the evaluation of patients at risk for COVID-19 are in a state of rapid change based on information released by regulatory bodies including the CDC and federal and state organizations. These policies and algorithms were followed during the patient's care in the ED.  Some ED evaluations and interventions may be delayed as a result of limited staffing during the pandemic.*  Note:  This document was prepared using Dragon voice recognition software and may include unintentional dictation errors.   Hinda Kehr, MD 12/23/18 [phone]

## 2018-12-23 LAB — URINALYSIS, COMPLETE (UACMP) WITH MICROSCOPIC
Bacteria, UA: NONE SEEN
Bilirubin Urine: NEGATIVE
Glucose, UA: NEGATIVE mg/dL
Hgb urine dipstick: NEGATIVE
Ketones, ur: NEGATIVE mg/dL
Leukocytes,Ua: NEGATIVE
Nitrite: NEGATIVE
Protein, ur: NEGATIVE mg/dL
Specific Gravity, Urine: 1.024 (ref 1.005–1.030)
Squamous Epithelial / LPF: NONE SEEN (ref 0–5)
pH: 5 (ref 5.0–8.0)

## 2018-12-23 MED ORDER — TAMSULOSIN HCL 0.4 MG PO CAPS: ORAL_CAPSULE | ORAL | 0 refills | Status: DC

## 2018-12-23 MED ORDER — DOCUSATE SODIUM 100 MG PO CAPS: ORAL_CAPSULE | ORAL | 0 refills | Status: DC

## 2018-12-23 MED ORDER — OXYCODONE-ACETAMINOPHEN 5-325 MG PO TABS: 2.0000 | ORAL_TABLET | Freq: Four times a day (QID) | ORAL | 0 refills | Status: DC | PRN

## 2018-12-23 MED ORDER — ONDANSETRON 4 MG PO TBDP: ORAL_TABLET | ORAL | 0 refills | Status: DC

## 2018-12-23 MED ORDER — SODIUM CHLORIDE 0.9 % IV SOLN
1.5000 mg/kg | Freq: Once | INTRAVENOUS | Status: AC
  Administered 2018-12-23: 136 mg via INTRAVENOUS
  Filled 2018-12-23: qty 6.8

## 2018-12-23 NOTE — Discharge Instructions (Signed)
You have been seen in the Emergency Department (ED) today for pain that we believe based on your workup, is caused by kidney stones.  As we have discussed, please drink plenty of fluids.  Please make a follow up appointment with the physician(s) listed elsewhere in this documentation. ° °You may take pain medication as needed but ONLY as prescribed.  Please also take your prescribed Flomax daily.  We also recommend that you take over-the-counter ibuprofen regularly according to label instructions over the next 5 days.  Take it with meals to minimize stomach discomfort. ° °Please see your doctor as soon as possible as stones may take 1-3 weeks to pass and you may require additional care or medications. ° °Do not drink alcohol, drive or participate in any other potentially dangerous activities while taking opiate pain medication as it may make you sleepy. Do not take this medication with any other sedating medications, either prescription or over-the-counter. If you were prescribed Percocet or Vicodin, do not take these with acetaminophen (Tylenol) as it is already contained within these medications. °  °Take Percocet as needed for severe pain.  This medication is an opiate (or narcotic) pain medication and can be habit forming.  Use it as little as possible to achieve adequate pain control.  Do not use or use it with extreme caution if you have a history of opiate abuse or dependence.  If you are on a pain contract with your primary care doctor or a pain specialist, be sure to let them know you were prescribed this medication today from the [facility].  This medication is intended for your use only - do not give any to anyone else and keep it in a secure place where nobody else, especially children, have access to it.  It will also cause or worsen constipation, so you may want to consider taking an over-the-counter stool softener while you are taking this medication. ° °Return to the  Emergency Department (ED) or call your doctor if you have any worsening pain, fever, painful urination, are unable to urinate, or develop other symptoms that concern you. ° °

## 2018-12-26 ENCOUNTER — Encounter: Payer: Self-pay | Admitting: Family Medicine

## 2018-12-27 ENCOUNTER — Ambulatory Visit (INDEPENDENT_AMBULATORY_CARE_PROVIDER_SITE_OTHER): Payer: No Typology Code available for payment source | Admitting: Family Medicine

## 2018-12-27 ENCOUNTER — Telehealth: Payer: Self-pay | Admitting: Family Medicine

## 2018-12-27 ENCOUNTER — Other Ambulatory Visit: Payer: Self-pay

## 2018-12-27 ENCOUNTER — Ambulatory Visit: Payer: Self-pay | Admitting: Family Medicine

## 2018-12-27 ENCOUNTER — Encounter: Payer: Self-pay | Admitting: Family Medicine

## 2018-12-27 VITALS — Ht 75.0 in | Wt 185.0 lb

## 2018-12-27 DIAGNOSIS — N200 Calculus of kidney: Secondary | ICD-10-CM

## 2018-12-27 DIAGNOSIS — N132 Hydronephrosis with renal and ureteral calculous obstruction: Secondary | ICD-10-CM | POA: Insufficient documentation

## 2018-12-27 DIAGNOSIS — K8020 Calculus of gallbladder without cholecystitis without obstruction: Secondary | ICD-10-CM | POA: Diagnosis not present

## 2018-12-27 DIAGNOSIS — K5909 Other constipation: Secondary | ICD-10-CM

## 2018-12-27 MED ORDER — POLYETHYLENE GLYCOL 3350 17 GM/SCOOP PO POWD: 17.0000 g | Freq: Every day | ORAL | 0 refills | Status: DC

## 2018-12-27 NOTE — Telephone Encounter (Signed)
I returned pt's call.   He is c/o only having one BM on Tuesday since Sunday.   He went to the ED Sunday night and was dx with a kidney stone.   He has been taking Percocet at night and Ibuprofen during the day for the pain.   "I don't feel well and don't have much appetite so I'm not eating much of nothing".   He also c/o having a headache. I did encourage him to drink fluids for the constipation and the kidney stone.  I warm transfer the call into Dr. Ancil Boozer' office to be scheduled after I went over the Care advice for constipation.     I sent my notes to the office   Reason for Disposition . Last bowel movement (BM) > 4 days ago  Answer Assessment - Initial Assessment Questions 1. STOOL PATTERN OR FREQUENCY: "How often do you pass bowel movements (BMs)?"  (Normal range: tid to q 3 days)  "When was the last BM passed?"       Been 5 days since had a BM.   I found out I have a kidney stone Sunday night.    I'm taking stool softener.    I'm taking ibuprofen.   At night I use Percocet for the pain from the kidney stone.   Night before I took 2 Percocet.   2. STRAINING: "Do you have to strain to have a BM?"      One time I went  3 days ago.   I took 3 stool softeners this morning Colace.   3. RECTAL PAIN: "Does your rectum hurt when the stool comes out?" If so, ask: "Do you have hemorrhoids? How bad is the pain?"  (Scale 1-10; or mild, moderate, severe)     No abd pain or rectal pain.   4. STOOL COMPOSITION: "Are the stools hard?"      Tuesday I had a BM that was hard. 5. BLOOD ON STOOLS: "Has there been any blood on the toilet tissue or on the surface of the BM?" If so, ask: "When was the last time?"      No 6. CHRONIC CONSTIPATION: "Is this a new problem for you?"  If no, ask: "How long have you had this problem?" (days, weeks, months)      No  Never had a problem. 7. CHANGES IN DIET: "Have there been any recent changes in your diet?"      I'm not eating much.   Not much of an appetite. 8.  MEDICATIONS: "Have you been taking any new medications?"     Percocet and Ibuprofen 9. LAXATIVES: "Have you been using any laxatives or enemas?"  If yes, ask "What, how often, and when was the last time?"     Taking Colace 3 times a day and this morning I took 3 at one time. 10. CAUSE: "What do you think is causing the constipation?"        I have a kidney stone on the left side and taking Percocet and Ibuprofen. 11. OTHER SYMPTOMS: "Do you have any other symptoms?" (e.g., abdominal pain, fever, vomiting)       Pain in left side from kidney stone.   12. PREGNANCY: "Is there any chance you are pregnant?" "When was your last menstrual period?"       N/A  Protocols used: CONSTIPATION-A-AH

## 2018-12-27 NOTE — Progress Notes (Signed)
Name: Kristopher Osborne   MRN: [id number]    DOB: [birth year]   Date:12/27/2018       Progress Note  Subjective  Chief Complaint  Chief Complaint  Patient presents with  . Constipation    Last bowel movement was on Tuesday, hard stools, denies any straining  . Flank Pain    Onset-Sunday evening, went to the ER did blood work, urine, CT Renal-Left Urethra Kidney Stone  . Nausea    I connected with  TRAVARUS TRUDO  on 12/27/18 at  3:20 PM EDT by a video enabled telemedicine application and verified that I am speaking with the correct person using two identifiers.  I discussed the limitations of evaluation and management by telemedicine and the availability of in person appointments. The patient expressed understanding and agreed to proceed. Staff also discussed with the patient that there may be a patient responsible charge related to this service. Patient Location: at  Home  Provider Location: [facility]   HPI  Kidney stone: CT also showed mild hydronephrosis on left kidney , 3 mm stone mid ureter, peripheric stranding noticed. He states since he went to [facility] 5 days ago his pain was 10/10, he states pain not as severe is down to 5/10 and taking percocet prn only. He states it does not feel like the stone is moving. He has noticed some nausea this am, lack of appetite and not eating much. He denies chills or fever. He states able to void , light yellow in color and no pain. He states only had one bowel movement in the past 3 days, he states it was normal in shape and color. No blood and no straining. He has been taking colace daily and took 3 today but no bowel movements. He states he is not feeling any bloating. He has been passing flatus.   Patient Active Problem List   Diagnosis Date Noted  . Actinic keratosis 01/23/2018  . Hyperlipidemia 12/19/2017    Past Surgical History:  Procedure Laterality Date  . FOOT SURGERY Left 2012    Family History  Problem  Relation Age of Onset  . Hepatitis C Father   . Stroke Maternal Grandmother   . Stroke Paternal Grandmother     Social History   Socioeconomic History  . Marital status: Married    Spouse name: Helene Kelp   . Number of children: 1  . Years of education: Not on file  . Highest education level: GED or equivalent  Occupational History  . Occupation: Dealer   Social Needs  . Financial resource strain: Not hard at all  . Food insecurity    Worry: Never true    Inability: Never true  . Transportation needs    Medical: No    Non-medical: No  Tobacco Use  . Smoking status: Former Smoker    Packs/day: 0.50    Years: 7.00    Pack years: 3.50    Types: Cigarettes    Start date: 05/23/1987    Quit date: 12/20/1994    Years since quitting: 24.0  . Smokeless tobacco: Never Used  Substance and Sexual Activity  . Alcohol use: Yes  . Drug use: No  . Sexual activity: Not on file  Lifestyle  . Physical activity    Days per week: 2 days    Minutes per session: 60 min  . Stress: Not at all  Relationships  . Social Herbalist on phone: Three times a week  Gets together: Twice a week    Attends religious service: Never    Active member of club or organization: No    Attends meetings of clubs or organizations: Never    Relationship status: Married  . Intimate partner violence    Fear of current or ex partner: No    Emotionally abused: No    Physically abused: No    Forced sexual activity: No  Other Topics Concern  . Not on file  Social History Narrative  . Not on file     Current Outpatient Medications:  .  docusate sodium (COLACE) 100 MG capsule, Take 1 tablet once or twice daily as needed for constipation while taking narcotic pain medicine, Disp: 30 capsule, Rfl: 0 .  Multiple Vitamin (MULTIVITAMIN) tablet, Take 1 tablet by mouth daily., Disp: , Rfl:  .  ondansetron (ZOFRAN ODT) 4 MG disintegrating tablet, Allow 1-2 tablets to dissolve in your mouth every 8 hours  as needed for nausea/vomiting, Disp: 30 tablet, Rfl: 0 .  oxyCODONE-acetaminophen (PERCOCET) 5-325 MG tablet, Take 2 tablets by mouth every 6 (six) hours as needed for severe pain., Disp: 16 tablet, Rfl: 0 .  tamsulosin (FLOMAX) 0.4 MG CAPS capsule, Take 1 tablet by mouth daily until you pass the kidney stone or no longer have symptoms, Disp: 14 capsule, Rfl: 0  No Known Allergies  I personally reviewed active problem list, medication list, allergies, family history, social history with the patient/caregiver today.   ROS  Ten systems reviewed and is negative except as mentioned in HPI   Objective  Virtual encounter, vitals obtained at home  Today's Vitals   12/27/18 1343 12/27/18 1346  BP: (!) 135/91   Pulse: (!) 55   Weight: 185 lb (83.9 kg)   Height: 6\' 3"  (1.905 m)   PainSc:  2    Body mass index is 23.12 kg/m.  Physical Exam  Awake alert and oriented   PHQ2/9: Depression screen [facility] 2/9 12/27/2018 06/21/2018 12/19/2017 04/27/2016  Decreased Interest 0 0 0 0  Down, Depressed, Hopeless 0 0 0 0  PHQ - 2 Score 0 0 0 0  Altered sleeping 0 - 0 -  Tired, decreased energy 0 - 0 -  Change in appetite 0 - 0 -  Feeling bad or failure about yourself  0 - 0 -  Trouble concentrating 0 - 0 -  Moving slowly or fidgety/restless 0 - 0 -  Suicidal thoughts 0 - 0 -  PHQ-9 Score 0 - 0 -  Difficult doing work/chores Not difficult at all - Not difficult at all -   PHQ-2/9 Result is negative.    Fall Risk: Fall Risk  12/27/2018 06/21/2018 12/19/2017 04/27/2016  Falls in the past year? 0 0 No No  Number falls in past yr: 0 0 - -  Injury with Fall? 0 0 - -     Assessment & Plan  1. Kidney stone on left side  - Ambulatory referral to Urology  2. Calculus of gallbladder without cholecystitis without obstruction  Discussed findings from CT  3. Other constipation  - polyethylene glycol powder (GLYCOLAX/MIRALAX) 17 GM/SCOOP powder; Take 17 g by mouth daily.  Dispense: 3350 g; Refill: 0   4. Hydronephrosis with urinary obstruction due to renal calculus  - Ambulatory referral to Urology  I discussed the assessment and treatment plan with the patient. The patient was provided an opportunity to ask questions and all were answered. The patient agreed with the plan and demonstrated an understanding  of the instructions.  The patient was advised to call back or seek an in-person evaluation if the symptoms worsen or if the condition fails to improve as anticipated.  I provided 25  minutes of non-face-to-face time during this encounter.

## 2018-12-27 NOTE — Telephone Encounter (Signed)
Referral Request - Has patient seen PCP for this complaint? No *If NO, is insurance requiring patient see PCP for this issue before PCP can refer them? Referral for which specialty: urology  Preferred provider/office: [facility] Reason for referral: Pt has a 21mm kidney stone

## 2018-12-27 NOTE — Telephone Encounter (Signed)
Pt has not had a bowel movement since Sunday. 12/22/2018.

## 2019-01-01 ENCOUNTER — Encounter: Payer: Self-pay | Admitting: Urology

## 2019-01-01 ENCOUNTER — Ambulatory Visit
Admission: RE | Admit: 2019-01-01 | Discharge: 2019-01-01 | Disposition: A | Discharge status: Home / Self Care | Payer: No Typology Code available for payment source | Source: Ambulatory Visit | Attending: Urology | Admitting: Urology

## 2019-01-01 ENCOUNTER — Ambulatory Visit (INDEPENDENT_AMBULATORY_CARE_PROVIDER_SITE_OTHER): Payer: No Typology Code available for payment source | Admitting: Urology

## 2019-01-01 ENCOUNTER — Other Ambulatory Visit: Payer: Self-pay

## 2019-01-01 VITALS — BP 125/84 | HR 70 | Wt 200.0 lb

## 2019-01-01 DIAGNOSIS — N200 Calculus of kidney: Secondary | ICD-10-CM

## 2019-01-01 NOTE — Patient Instructions (Signed)
Dietary Guidelines to Help Prevent Kidney Stones Kidney stones are deposits of minerals and salts that form inside your kidneys. Your risk of developing kidney stones may be greater depending on your diet, your lifestyle, the medicines you take, and whether you have certain medical conditions. Most people can reduce their chances of developing kidney stones by following the instructions below. Depending on your overall health and the type of kidney stones you tend to develop, your dietitian may give you more specific instructions. What are tips for following this plan? Reading food labels  Choose foods with "no salt added" or "low-salt" labels. Limit your sodium intake to less than 1500 mg per day.  Choose foods with calcium for each meal and snack. Try to eat about 300 mg of calcium at each meal. Foods that contain 200-500 mg of calcium per serving include: ? 8 oz (237 ml) of milk, fortified nondairy milk, and fortified fruit juice. ? 8 oz (237 ml) of kefir, yogurt, and soy yogurt. ? 4 oz (118 ml) of tofu. ? 1 oz of cheese. ? 1 cup (300 g) of dried figs. ? 1 cup (91 g) of cooked broccoli. ? 1-3 oz can of sardines or mackerel.  Most people need 1000 to 1500 mg of calcium each day. Talk to your dietitian about how much calcium is recommended for you. Shopping  Buy plenty of fresh fruits and vegetables. Most people do not need to avoid fruits and vegetables, even if they contain nutrients that may contribute to kidney stones.  When shopping for convenience foods, choose: ? Whole pieces of fruit. ? Premade salads with dressing on the side. ? Low-fat fruit and yogurt smoothies.  Avoid buying frozen meals or prepared deli foods.  Look for foods with live cultures, such as yogurt and kefir. Cooking  Do not add salt to food when cooking. Place a salt shaker on the table and allow each person to add his or her own salt to taste.  Use vegetable protein, such as beans, textured vegetable  protein (TVP), or tofu instead of meat in pasta, casseroles, and soups. Meal planning   Eat less salt, if told by your dietitian. To do this: ? Avoid eating processed or premade food. ? Avoid eating fast food.  Eat less animal protein, including cheese, meat, poultry, or fish, if told by your dietitian. To do this: ? Limit the number of times you have meat, poultry, fish, or cheese each week. Eat a diet free of meat at least 2 days a week. ? Eat only one serving each day of meat, poultry, fish, or seafood. ? When you prepare animal protein, cut pieces into small portion sizes. For most meat and fish, one serving is about the size of one deck of cards.  Eat at least 5 servings of fresh fruits and vegetables each day. To do this: ? Keep fruits and vegetables on hand for snacks. ? Eat 1 piece of fruit or a handful of berries with breakfast. ? Have a salad and fruit at lunch. ? Have two kinds of vegetables at dinner.  Limit foods that are high in a substance called oxalate. These include: ? Spinach. ? Rhubarb. ? Beets. ? Potato chips and french fries. ? Nuts.  If you regularly take a diuretic medicine, make sure to eat at least 1-2 fruits or vegetables high in potassium each day. These include: ? Avocado. ? Banana. ? Orange, prune, carrot, or tomato juice. ? Baked potato. ? Cabbage. ? Beans and split   peas. General instructions   Drink enough fluid to keep your urine clear or pale yellow. This is the most important thing you can do.  Talk to your health care provider and dietitian about taking daily supplements. Depending on your health and the cause of your kidney stones, you may be advised: ? Not to take supplements with vitamin C. ? To take a calcium supplement. ? To take a daily probiotic supplement. ? To take other supplements such as magnesium, fish oil, or vitamin B6.  Take all medicines and supplements as told by your health care provider.  Limit alcohol intake to no  more than 1 drink a day for nonpregnant women and 2 drinks a day for men. One drink equals 12 oz of beer, 5 oz of wine, or 1 oz of hard liquor.  Lose weight if told by your health care provider. Work with your dietitian to find strategies and an eating plan that works best for you. What foods are not recommended? Limit your intake of the following foods, or as told by your dietitian. Talk to your dietitian about specific foods you should avoid based on the type of kidney stones and your overall health. Grains Breads. Bagels. Rolls. Baked goods. Salted crackers. Cereal. Pasta. Vegetables Spinach. Rhubarb. Beets. Canned vegetables. Kristopher Osborne. Olives. Meats and other protein foods Nuts. Nut butters. Large portions of meat, poultry, or fish. Salted or cured meats. Deli meats. Hot dogs. Sausages. Dairy Cheese. Beverages Regular soft drinks. Regular vegetable juice. Seasonings and other foods Seasoning blends with salt. Salad dressings. Canned soups. Soy sauce. Ketchup. Barbecue sauce. Canned pasta sauce. Casseroles. Pizza. Lasagna. Frozen meals. Potato chips. [place] fries. Summary  You can reduce your risk of kidney stones by making changes to your diet.  The most important thing you can do is drink enough fluid. You should drink enough fluid to keep your urine clear or pale yellow.  Ask your health care provider or dietitian how much protein from animal sources you should eat each day, and also how much salt and calcium you should have each day. This information is not intended to replace advice given to you by your health care provider. Make sure you discuss any questions you have with your health care provider. Document Released: 09/02/2010 Document Revised: 08/28/2018 Document Reviewed: 04/18/2016 Elsevier Patient Education  2020 [place] is a treatment that can sometimes help eliminate kidney stones and the pain that they cause. A form of lithotripsy,  also known as extracorporeal shock wave lithotripsy, is a nonsurgical procedure that crushes a kidney stone with shock waves. These shock waves pass through your body and focus on the kidney stone. They cause the kidney stone to break up while it is still in the urinary tract. This makes it easier for the smaller pieces of stone to pass in the urine. Tell a health care provider about:  Any allergies you have.  All medicines you are taking, including vitamins, herbs, eye drops, creams, and over-the-counter medicines.  Any blood disorders you have.  Any surgeries you have had.  Any medical conditions you have.  Whether you are pregnant or may be pregnant.  Any problems you or family members have had with anesthetic medicines. What are the risks? Generally, this is a safe procedure. However, problems may occur, including:  Infection.  Bleeding of the kidney.  Bruising of the kidney or skin.  Scarring of the kidney, which can lead to: ? Increased blood pressure. ? Poor  kidney function. ? Return (recurrence) of kidney stones.  Damage to other structures or organs, such as the liver, colon, spleen, or pancreas.  Blockage (obstruction) of the the tube that carries urine from the kidney to the bladder (ureter).  Failure of the kidney stone to break into pieces (fragments). What happens before the procedure? Staying hydrated Follow instructions from your health care provider about hydration, which may include:  Up to 2 hours before the procedure - you may continue to drink clear liquids, such as water, clear fruit juice, black coffee, and plain tea. Eating and drinking restrictions Follow instructions from your health care provider about eating and drinking, which may include:  8 hours before the procedure - stop eating heavy meals or foods such as meat, fried foods, or fatty foods.  6 hours before the procedure - stop eating light meals or foods, such as toast or cereal.  6  hours before the procedure - stop drinking milk or drinks that contain milk.  2 hours before the procedure - stop drinking clear liquids. General instructions  Plan to have someone take you home from the hospital or clinic.  Ask your health care provider about: ? Changing or stopping your regular medicines. This is especially important if you are taking diabetes medicines or blood thinners. ? Taking medicines such as aspirin and ibuprofen. These medicines and other NSAIDs can thin your blood. Do not take these medicines for 7 days before your procedure if your health care provider instructs you not to.  You may have tests, such as: ? Blood tests. ? Urine tests. ? Imaging tests, such as a CT scan. What happens during the procedure?  To lower your risk of infection: ? Your health care team will wash or sanitize their hands. ? Your skin will be washed with soap.  An IV tube will be inserted into one of your veins. This tube will give you fluids and medicines.  You will be given one or more of the following: ? A medicine to help you relax (sedative). ? A medicine to make you fall asleep (general anesthetic).  A water-filled cushion may be placed behind your kidney or on your abdomen. In some cases you may be placed in a tub of lukewarm water.  Your body will be positioned in a way that makes it easy to target the kidney stone.  A flexible tube with holes in it (stent) may be placed in the ureter. This will help keep urine flowing from the kidney if the fragments of the stone have been blocking the ureter.  An X-ray or ultrasound exam will be done to locate your stone.  Shock waves will be aimed at the stone. If you are awake, you may feel a tapping sensation as the shock waves pass through your body. The procedure may vary among health care providers and hospitals. What happens after the procedure?  You may have an X-ray to see whether the procedure was able to break up the kidney  stone and how much of the stone has passed. If large stone fragments remain after treatment, you may need to have a second procedure at a later time.  Your blood pressure, heart rate, breathing rate, and blood oxygen level will be monitored until the medicines you were given have worn off.  You may be given antibiotics or pain medicine as needed.  If a stent was placed in your ureter during surgery, it may stay in place for a few weeks.  You  may need strain your urine to collect pieces of the kidney stone for testing.  You will need to drink plenty of water.  Do not drive for 24 hours if you were given a sedative. Summary  Lithotripsy is a treatment that can sometimes help eliminate kidney stones and the pain that they cause.  A form of lithotripsy, also known as extracorporeal shock wave lithotripsy, is a nonsurgical procedure that crushes a kidney stone with shock waves.  Generally, this is a safe procedure. However, problems may occur, including damage to the kidney or other organs, infection, or obstruction of the tube that carries urine from the kidney to the bladder (ureter).  When you go home, you will need to drink plenty of water. You may be asked to strain your urine to collect pieces of the kidney stone for testing. This information is not intended to replace advice given to you by your health care provider. Make sure you discuss any questions you have with your health care provider. Document Released: 05/05/2000 Document Revised: 08/19/2018 Document Reviewed: 03/29/2016 Elsevier Patient Education  2020 [employer].

## 2019-01-01 NOTE — Progress Notes (Addendum)
01/01/19 3:16 PM   Kristopher Osborne 04-May-1971 [id number]  Referring provider: Steele Sizer, MD [address] [place],  [place] [address]  CC: Left ureteral stone  HPI: I saw Kristopher Osborne in urology clinic today in consultation from Dr. Ancil Boozer for a left ureteral stone.  He is a healthy 48 year old male that presented to the ED on 12/22/2018 with left low back pain and CT demonstrated a 4 mm left mid ureteral stone with upstream hydronephrosis.  There were no renal stones noted at that time.  Urinalysis was non-infected and he was discharged home with medical expulsive therapy.  He reports he is not had any pain over the last week and is doing well.  He has not seen a stone pass.  He denies any gross hematuria, fevers, or chills.  He is no longer taking Flomax or narcotics.  He did have some significant constipation while taking the narcotics.  There are no aggravating or alleviating factors.  Severity is mild.  On CT, stone measures 4.5 mm in the left mid ureter, 12 cm skin to stone distance, 775HU.  Not able to clearly visualize on scout CT.  He denies any prior stone episodes.  PMH: Past Medical History:  Diagnosis Date  . Hypercholesteremia     Surgical History: Past Surgical History:  Procedure Laterality Date  . FOOT SURGERY Left 2012    Allergies: No Known Allergies  Family History: Family History  Problem Relation Age of Onset  . Hepatitis C Father   . Stroke Maternal Grandmother   . Stroke Paternal Grandmother     Social History:  reports that he quit smoking about 24 years ago. His smoking use included cigarettes. He started smoking about 31 years ago. He has a 3.50 pack-year smoking history. He has never used smokeless tobacco. He reports current alcohol use. He reports that he does not use drugs.  ROS: Please see flowsheet from today's date for complete review of systems.  Physical Exam: BP 125/84   Pulse 70   Wt 200 lb (90.7 kg)   BMI  25.00 kg/m    Constitutional:  Alert and oriented, No acute distress. Cardiovascular: No clubbing, cyanosis, or edema. Respiratory: Normal respiratory effort, no increased work of breathing. GI: Abdomen is soft, nontender, nondistended, no abdominal masses GU: No CVA tenderness, phallus without lesions Lymph: No cervical or inguinal lymphadenopathy. Skin: No rashes, bruises or suspicious lesions. Neurologic: Grossly intact, no focal deficits, moving all 4 extremities. Psychiatric: Normal mood and affect.  Laboratory Data: Reviewed, see HPI  Pertinent Imaging: Reviewed, see HPI  Assessment & Plan:   In summary, the patient is a healthy 48 year old male who presented to the emergency department on 12/22/2018 with acute onset of left-sided flank pain and CT demonstrating a 4 mm left mid ureteral stone with upstream hydronephrosis.  His pain is completely resolved over the last week and he is no longer requiring any pain medications.  Suspect he has passed a stone spontaneously.  I recommended KUB today to rule out residual stone.  If visible on KUB, he would be an excellent candidate for shockwave lithotripsy.  He would like to avoid a ureteral stent if possible.  We discussed the risks and benefits of ureteroscopy versus shockwave lithotripsy at length.  KUB today, call with results If persistent left ureteral stone, will schedule shockwave lithotripsy  Addendum: Left ureteral stone has migrated down to left distal ureter.  Clearly visible on KUB.  He cannot come in tomorrow  for shockwave lithotripsy, but we will schedule for Thursday 8/20 for left SWL for 16mm distal ureteral stone.  We will repeat KUB to confirm stone still present prior to procedure  Billey Co, [place] [address], [place], [place] [address] [phone]

## 2019-01-02 ENCOUNTER — Telehealth: Payer: Self-pay

## 2019-01-02 LAB — URINALYSIS, COMPLETE
Bilirubin, UA: NEGATIVE
Glucose, UA: NEGATIVE
Ketones, UA: NEGATIVE
Leukocytes,UA: NEGATIVE
Nitrite, UA: NEGATIVE
RBC, UA: NEGATIVE
Specific Gravity, UA: >1.03 — ABNORMAL HIGH (ref 1.005–1.030)
Urobilinogen, Ur: 0.2 mg/dL (ref 0.2–1.0)
pH, UA: 5 (ref 5.0–7.5)

## 2019-01-02 LAB — MICROSCOPIC EXAMINATION
Bacteria, UA: NONE SEEN
Epithelial Cells (non renal): NONE SEEN /hpf (ref 0–10)
RBC, Urine: NONE SEEN /hpf (ref 0–2)
WBC, UA: NONE SEEN /hpf (ref 0–5)

## 2019-01-02 NOTE — Telephone Encounter (Signed)
-----   Message from Billey Co, MD sent at 01/01/2019  4:37 PM EDT ----- His left sided kidney stone is still there, but it has moved down closer to the bladder. Amy will call him to set up shockwave procedure to break up the stone as we discussed in clinic. We will repeat an xray before that procedure as well to be sure it hasnt passed. Call us if he has worsening pain or fevers.  Nickolas Madrid, MD 01/01/2019

## 2019-01-02 NOTE — Telephone Encounter (Signed)
Called pt, no answer. LM for pt informing him of all information below. Advised pt to call back for questions or concerns.

## 2019-12-22 ENCOUNTER — Telehealth: Payer: Self-pay

## 2019-12-22 NOTE — Telephone Encounter (Signed)
Lmom to confirm and screen for 12-24-19 ov. °

## 2019-12-24 ENCOUNTER — Other Ambulatory Visit: Payer: Self-pay

## 2019-12-24 ENCOUNTER — Encounter: Payer: Self-pay | Admitting: Internal Medicine

## 2019-12-24 ENCOUNTER — Ambulatory Visit (INDEPENDENT_AMBULATORY_CARE_PROVIDER_SITE_OTHER): Payer: No Typology Code available for payment source | Admitting: Internal Medicine

## 2019-12-24 DIAGNOSIS — N201 Calculus of ureter: Secondary | ICD-10-CM

## 2019-12-24 DIAGNOSIS — Z7689 Persons encountering health services in other specified circumstances: Secondary | ICD-10-CM | POA: Diagnosis not present

## 2019-12-24 DIAGNOSIS — E782 Mixed hyperlipidemia: Secondary | ICD-10-CM | POA: Diagnosis not present

## 2019-12-24 DIAGNOSIS — R300 Dysuria: Secondary | ICD-10-CM | POA: Diagnosis not present

## 2019-12-24 LAB — POCT URINALYSIS DIPSTICK
Blood, UA: NEGATIVE
Glucose, UA: NEGATIVE
Ketones, UA: NEGATIVE
Leukocytes, UA: NEGATIVE
Nitrite, UA: NEGATIVE
Protein, UA: POSITIVE — AB
Spec Grav, UA: 1.025 (ref 1.010–1.025)
Urobilinogen, UA: 0.2 E.U./dL
pH, UA: 5 (ref 5.0–8.0)

## 2019-12-24 MED ORDER — TAMSULOSIN HCL 0.4 MG PO CAPS: 0.4000 mg | ORAL_CAPSULE | Freq: Every day | ORAL | 3 refills | Status: DC

## 2019-12-24 NOTE — Progress Notes (Signed)
[facility] [place], [place] [address]  Internal MEDICINE  Office Visit Note  Patient Name: Kristopher Osborne  [id number]  [id number]  Date of Service: 01/02/2020   Complaints/HPI Pt is here for establishment of PCP. Chief Complaint  Patient presents with  . New Patient (Initial Visit)    left side pain, previous kidney stone - may not have passed it  . Hyperlipidemia  . Quality Metric Gaps    colonoscopy, does not intend on getting covid vaccine   HPI  Current Medication: Outpatient Encounter Medications as of 12/24/2019  Medication Sig  . [DISCONTINUED] docusate sodium (COLACE) 100 MG capsule Take 1 tablet once or twice daily as needed for constipation while taking narcotic pain medicine  . [DISCONTINUED] Multiple Vitamin (MULTIVITAMIN) tablet Take 1 tablet by mouth daily.  . [DISCONTINUED] ondansetron (ZOFRAN ODT) 4 MG disintegrating tablet Allow 1-2 tablets to dissolve in your mouth every 8 hours as needed for nausea/vomiting  . [DISCONTINUED] oxyCODONE-acetaminophen (PERCOCET) 5-325 MG tablet Take 2 tablets by mouth every 6 (six) hours as needed for severe pain.  . [DISCONTINUED] polyethylene glycol powder (GLYCOLAX/MIRALAX) 17 GM/SCOOP powder Take 17 g by mouth daily.  . [DISCONTINUED] tamsulosin (FLOMAX) 0.4 MG CAPS capsule Take 1 tablet by mouth daily until you pass the kidney stone or no longer have symptoms  . tamsulosin (FLOMAX) 0.4 MG CAPS capsule Take 1 capsule (0.4 mg total) by mouth daily.   No facility-administered encounter medications on file as of 12/24/2019.    Surgical History: Past Surgical History:  Procedure Laterality Date  . FOOT SURGERY Left 2012    Medical History: Past Medical History:  Diagnosis Date  . Hypercholesteremia     Family History: Family History  Problem Relation Age of Onset  . Hepatitis C Father   . Stroke Maternal Grandmother   . Stroke Paternal Grandmother     Social History   Socioeconomic  History  . Marital status: Married    Spouse name: Helene Kelp   . Number of children: 1  . Years of education: Not on file  . Highest education level: GED or equivalent  Occupational History  . Occupation: Dealer   Tobacco Use  . Smoking status: Former Smoker    Packs/day: 0.50    Years: 7.00    Pack years: 3.50    Types: Cigarettes    Start date: 05/23/1987    Quit date: 08/01/1994    Years since quitting: 25.4  . Smokeless tobacco: Never Used  Vaping Use  . Vaping Use: Never used  Substance and Sexual Activity  . Alcohol use: Yes  . Drug use: No  . Sexual activity: Not on file  Other Topics Concern  . Not on file  Social History Narrative  . Not on file   Social Determinants of Health   Financial Resource Strain:   . Difficulty of Paying Living Expenses:   Food Insecurity:   . Worried About Charity fundraiser in the Last Year:   . Arboriculturist in the Last Year:   Transportation Needs:   . Film/video editor (Medical):   Marland Kitchen Lack of Transportation (Non-Medical):   Physical Activity:   . Days of Exercise per Week:   . Minutes of Exercise per Session:   Stress:   . Feeling of Stress :   Social Connections:   . Frequency of Communication with Friends and Family:   . Frequency of Social Gatherings with Friends and Family:   .  Attends Religious Services:   . Active Member of Clubs or Organizations:   . Attends Archivist Meetings:   Marland Kitchen Marital Status:   Intimate Partner Violence:   . Fear of Current or Ex-Partner:   . Emotionally Abused:   Marland Kitchen Physically Abused:   . Sexually Abused:    Review of Systems  Constitutional: Negative for chills, fatigue and unexpected weight change.  HENT: Negative for congestion, postnasal drip, rhinorrhea, sneezing and sore throat.   Eyes: Negative for redness.  Respiratory: Negative for cough, chest tightness and shortness of breath.   Cardiovascular: Negative for chest pain and palpitations.  Gastrointestinal:  Negative for abdominal pain, constipation, diarrhea, nausea and vomiting.  Genitourinary: Negative for dysuria and frequency.  Musculoskeletal: Negative for arthralgias, back pain, joint swelling and neck pain.  Skin: Negative for rash.  Neurological: Negative.  Negative for tremors and numbness.  Hematological: Negative for adenopathy. Does not bruise/bleed easily.  Psychiatric/Behavioral: Negative for behavioral problems (Depression), sleep disturbance and suicidal ideas. The patient is not nervous/anxious.     Vital Signs: BP 127/82   Pulse 60   Temp (!) 97.4 F (36.3 C)   Resp 16   Ht 6\' 3"  (1.905 m)   Wt 208 lb (94.3 kg)   SpO2 97%   BMI 26.00 kg/m   Physical Exam Constitutional:      General: He is not in acute distress.    Appearance: He is well-developed. He is not diaphoretic.  HENT:     Head: Normocephalic and atraumatic.     Mouth/Throat:     Pharynx: No oropharyngeal exudate.  Eyes:     Pupils: Pupils are equal, round, and reactive to light.  Neck:     Thyroid: No thyromegaly.     Vascular: No JVD.     Trachea: No tracheal deviation.  Cardiovascular:     Rate and Rhythm: Normal rate and regular rhythm.     Heart sounds: Normal heart sounds. No murmur heard.  No friction rub. No gallop.   Pulmonary:     Effort: Pulmonary effort is normal. No respiratory distress.     Breath sounds: No wheezing or rales.  Chest:     Chest wall: No tenderness.  Abdominal:     General: Bowel sounds are normal.     Palpations: Abdomen is soft.  Musculoskeletal:        General: Normal range of motion.     Cervical back: Normal range of motion and neck supple.  Lymphadenopathy:     Cervical: No cervical adenopathy.  Skin:    General: Skin is warm and dry.  Neurological:     Mental Status: He is alert and oriented to person, place, and time.     Cranial Nerves: No cranial nerve deficit.  Psychiatric:        Behavior: Behavior normal.        Thought Content: Thought  content normal.        Judgment: Judgment normal.    Assessment/Plan: 1. Left ureteral calculus - Pt is not in pain, will monitor  - POCT Urinalysis Dipstick - tamsulosin (FLOMAX) 0.4 MG CAPS capsule; Take 1 capsule (0.4 mg total) by mouth daily.  Dispense: 30 capsule; Refill: 3 - PSA   2. Mixed hyperlipidemia - Pt has h/o hyperlipidemia.  - PSA - Lipid Panel With LDL/HDL Ratio  3. Encounter to establish care - pt will need CPE  - PSA - CBC with Differential/Platelet; Future - Lipid Panel With LDL/HDL  Ratio; Future - TSH; Future - T4, free; Future - Comprehensive metabolic panel - T4, free - TSH - Lipid Panel With LDL/HDL Ratio - CBC with Differential/Platelet  General Counseling: Mehmet verbalizes understanding of the findings of todays visit and agrees with plan of treatment. I have discussed any further diagnostic evaluation that may be needed or ordered today. We also reviewed his medications today. he has been encouraged to call the office with any questions or concerns that should arise related to todays visit.  Orders Placed This Encounter  Procedures  . PSA  . CBC with Differential/Platelet  . Lipid Panel With LDL/HDL Ratio  . TSH  . T4, free  . Comprehensive metabolic panel  . POCT Urinalysis Dipstick    Meds ordered this encounter  Medications  . tamsulosin (FLOMAX) 0.4 MG CAPS capsule    Sig: Take 1 capsule (0.4 mg total) by mouth daily.    Dispense:  30 capsule    Refill:  3    Time spent:35 Minutes

## 2020-02-02 ENCOUNTER — Telehealth: Payer: Self-pay

## 2020-02-02 NOTE — Telephone Encounter (Signed)
CONFIRMED PT APPT 02/04/20 °

## 2020-02-03 LAB — CBC WITH DIFFERENTIAL/PLATELET
Basophils Absolute: 0.1 10*3/uL (ref 0.0–0.2)
Basos: 1 %
EOS (ABSOLUTE): 0.5 10*3/uL — ABNORMAL HIGH (ref 0.0–0.4)
Eos: 9 %
Hematocrit: 41.9 % (ref 37.5–51.0)
Hemoglobin: 14.7 g/dL (ref 13.0–17.7)
Immature Grans (Abs): 0 10*3/uL (ref 0.0–0.1)
Immature Granulocytes: 0 %
Lymphocytes Absolute: 1.7 10*3/uL (ref 0.7–3.1)
Lymphs: 32 %
MCH: 32 pg (ref 26.6–33.0)
MCHC: 35.1 g/dL (ref 31.5–35.7)
MCV: 91 fL (ref 79–97)
Monocytes Absolute: 0.4 10*3/uL (ref 0.1–0.9)
Monocytes: 8 %
Neutrophils Absolute: 2.6 10*3/uL (ref 1.4–7.0)
Neutrophils: 50 %
Platelets: 256 10*3/uL (ref 150–450)
RBC: 4.59 x10E6/uL (ref 4.14–5.80)
RDW: 11.7 % (ref 11.6–15.4)
WBC: 5.2 10*3/uL (ref 3.4–10.8)

## 2020-02-03 LAB — COMPREHENSIVE METABOLIC PANEL
ALT: 22 IU/L (ref 0–44)
AST: 18 IU/L (ref 0–40)
Albumin/Globulin Ratio: 1.7 (ref 1.2–2.2)
Albumin: 4.5 g/dL (ref 3.8–4.9)
Alkaline Phosphatase: 108 IU/L (ref 44–121)
BUN/Creatinine Ratio: 14 (ref 9–20)
BUN: 17 mg/dL (ref 6–24)
Bilirubin Total: 0.3 mg/dL (ref 0.0–1.2)
CO2: 25 mmol/L (ref 20–29)
Calcium: 9.6 mg/dL (ref 8.7–10.2)
Chloride: 102 mmol/L (ref 96–106)
Creatinine, Ser: 1.18 mg/dL (ref 0.76–1.27)
GFR calc Af Amer: 82 mL/min/{1.73_m2} (ref >59)
GFR calc non Af Amer: 71 mL/min/{1.73_m2} (ref >59)
Globulin, Total: 2.7 g/dL (ref 1.5–4.5)
Glucose: 96 mg/dL (ref 65–99)
Potassium: 5.6 mmol/L — ABNORMAL HIGH (ref 3.5–5.2)
Sodium: 140 mmol/L (ref 134–144)
Total Protein: 7.2 g/dL (ref 6.0–8.5)

## 2020-02-03 LAB — LIPID PANEL WITH LDL/HDL RATIO
Cholesterol, Total: 264 mg/dL — ABNORMAL HIGH (ref 100–199)
HDL: 46 mg/dL (ref >39)
LDL Chol Calc (NIH): 184 mg/dL — ABNORMAL HIGH (ref 0–99)
LDL/HDL Ratio: 4 ratio — ABNORMAL HIGH (ref 0.0–3.6)
Triglycerides: 184 mg/dL — ABNORMAL HIGH (ref 0–149)
VLDL Cholesterol Cal: 34 mg/dL (ref 5–40)

## 2020-02-03 LAB — PSA: Prostate Specific Ag, Serum: 0.7 ng/mL (ref 0.0–4.0)

## 2020-02-03 LAB — T4, FREE: Free T4: 1.28 ng/dL (ref 0.82–1.77)

## 2020-02-03 LAB — TSH: TSH: 1.57 u[IU]/mL (ref 0.450–4.500)

## 2020-02-04 ENCOUNTER — Other Ambulatory Visit: Payer: Self-pay

## 2020-02-04 ENCOUNTER — Ambulatory Visit (INDEPENDENT_AMBULATORY_CARE_PROVIDER_SITE_OTHER): Payer: No Typology Code available for payment source | Admitting: Internal Medicine

## 2020-02-04 ENCOUNTER — Encounter: Payer: Self-pay | Admitting: Internal Medicine

## 2020-02-04 DIAGNOSIS — R300 Dysuria: Secondary | ICD-10-CM

## 2020-02-04 DIAGNOSIS — R7301 Impaired fasting glucose: Secondary | ICD-10-CM | POA: Diagnosis not present

## 2020-02-04 DIAGNOSIS — Z0001 Encounter for general adult medical examination with abnormal findings: Secondary | ICD-10-CM

## 2020-02-04 DIAGNOSIS — Z1211 Encounter for screening for malignant neoplasm of colon: Secondary | ICD-10-CM | POA: Diagnosis not present

## 2020-02-04 DIAGNOSIS — E782 Mixed hyperlipidemia: Secondary | ICD-10-CM

## 2020-02-04 LAB — POCT GLYCOSYLATED HEMOGLOBIN (HGB A1C)

## 2020-02-04 MED ORDER — ROSUVASTATIN CALCIUM 5 MG PO TABS: 5.0000 mg | ORAL_TABLET | Freq: Every day | ORAL | 3 refills | Status: DC

## 2020-02-04 NOTE — Progress Notes (Signed)
[facility] [place], [place] [address]  Internal MEDICINE  Office Visit Note  Patient Name: Kristopher Osborne  [id number]  [id number]  Date of Service: 02/05/2020  Chief Complaint  Patient presents with  . Annual Exam  . Quality Metric Gaps    colonoscopy, flu vaccine, covid vaccine     HPI Pt is here for routine health maintenance examination. Denies any major complaints, works as a Dealer. Has had elevated glucose in the past but normal now. His lipid profile is abnormal with elevated HDL. He is not a smoker, no other risk factors. Sleeps well   Current Medication: Outpatient Encounter Medications as of 02/04/2020  Medication Sig  . rosuvastatin (CRESTOR) 5 MG tablet Take 1 tablet (5 mg total) by mouth daily.  . tamsulosin (FLOMAX) 0.4 MG CAPS capsule Take 1 capsule (0.4 mg total) by mouth daily. (Patient not taking: Reported on 02/04/2020)   No facility-administered encounter medications on file as of 02/04/2020.    Surgical History: Past Surgical History:  Procedure Laterality Date  . FOOT SURGERY Left 2012    Medical History: Past Medical History:  Diagnosis Date  . Hypercholesteremia     Family History: Family History  Problem Relation Age of Onset  . Hepatitis C Father   . Stroke Maternal Grandmother   . Stroke Paternal Grandmother       Review of Systems  Constitutional: Negative for chills, fatigue and unexpected weight change.  HENT: Negative for congestion, postnasal drip, rhinorrhea, sneezing and sore throat.   Eyes: Negative for redness.  Respiratory: Negative for cough, chest tightness and shortness of breath.   Cardiovascular: Negative for chest pain and palpitations.  Gastrointestinal: Negative for abdominal pain, constipation, diarrhea, nausea and vomiting.  Genitourinary: Negative for dysuria and frequency.  Musculoskeletal: Negative for arthralgias, back pain, joint swelling and neck pain.  Skin: Negative for  rash.  Neurological: Negative.  Negative for tremors and numbness.  Hematological: Negative for adenopathy. Does not bruise/bleed easily.  Psychiatric/Behavioral: Negative for behavioral problems (Depression), sleep disturbance and suicidal ideas. The patient is not nervous/anxious.      Vital Signs: BP 112/76   Pulse 65   Temp (!) 97.4 F (36.3 C)   Resp 16   Ht '6\' 3"'  (1.905 m)   Wt 204 lb 12.8 oz (92.9 kg)   SpO2 97%   BMI 25.60 kg/m    Physical Exam Constitutional:      General: He is not in acute distress.    Appearance: Normal appearance. He is well-developed. He is not diaphoretic.  HENT:     Head: Normocephalic and atraumatic.     Right Ear: Tympanic membrane normal.     Left Ear: Tympanic membrane normal.     Mouth/Throat:     Mouth: Mucous membranes are moist.     Pharynx: No oropharyngeal exudate.  Eyes:     Extraocular Movements: Extraocular movements intact.     Conjunctiva/sclera: Conjunctivae normal.     Pupils: Pupils are equal, round, and reactive to light.  Neck:     Thyroid: No thyromegaly.     Vascular: No carotid bruit or JVD.     Trachea: No tracheal deviation.  Cardiovascular:     Rate and Rhythm: Normal rate and regular rhythm.     Heart sounds: Normal heart sounds. No friction rub. No gallop.   Pulmonary:     Effort: Pulmonary effort is normal. No respiratory distress.     Breath sounds: Normal breath  sounds. No wheezing or rales.  Chest:     Chest wall: No tenderness.  Abdominal:     General: Bowel sounds are normal.     Palpations: Abdomen is soft.     Tenderness: There is no abdominal tenderness.  Musculoskeletal:        General: Normal range of motion.     Cervical back: Normal range of motion and neck supple. No rigidity.  Skin:    General: Skin is warm and dry.  Neurological:     General: No focal deficit present.     Mental Status: He is alert and oriented to person, place, and time. Mental status is at baseline.     Cranial  Nerves: No cranial nerve deficit.  Psychiatric:        Behavior: Behavior normal.        Thought Content: Thought content normal.        Judgment: Judgment normal.    LABS: Recent Results (from the past 2160 hour(s))  POCT Urinalysis Dipstick     Status: Abnormal   Collection Time: 12/24/19 10:20 AM  Result Value Ref Range   Color, UA     Clarity, UA     Glucose, UA Negative Negative   Bilirubin, UA Small    Ketones, UA negative    Spec Grav, UA 1.025 1.010 - 1.025   Blood, UA Negative    pH, UA 5.0 5.0 - 8.0   Protein, UA Positive (A) Negative   Urobilinogen, UA 0.2 0.2 or 1.0 E.U./dL   Nitrite, UA Negative    Leukocytes, UA Negative Negative   Appearance     Odor    T4, free     Status: None   Collection Time: 02/02/20  9:43 AM  Result Value Ref Range   Free T4 1.28 0.82 - 1.77 ng/dL  PSA     Status: None   Collection Time: 02/02/20  9:44 AM  Result Value Ref Range   Prostate Specific Ag, Serum 0.7 0.0 - 4.0 ng/mL    Comment: Roche ECLIA methodology. According to the American Urological Association, Serum PSA should decrease and remain at undetectable levels after radical prostatectomy. The AUA defines biochemical recurrence as an initial PSA value 0.2 ng/mL or greater followed by a subsequent confirmatory PSA value 0.2 ng/mL or greater. Values obtained with different assay methods or kits cannot be used interchangeably. Results cannot be interpreted as absolute evidence of the presence or absence of malignant disease.   Comprehensive metabolic panel     Status: Abnormal   Collection Time: 02/02/20  9:44 AM  Result Value Ref Range   Glucose 96 65 - 99 mg/dL   BUN 17 6 - 24 mg/dL   Creatinine, Ser 1.18 0.76 - 1.27 mg/dL   GFR calc non Af Amer 71 >59 mL/min/1.73   GFR calc Af Amer 82 >59 mL/min/1.73    Comment: **[facility] currently reports eGFR in compliance with the current**   recommendations of the [employer]. [facility] will   update reporting as  new guidelines are published from the NKF-ASN   Task force.    BUN/Creatinine Ratio 14 9 - 20   Sodium 140 134 - 144 mmol/L   Potassium 5.6 (H) 3.5 - 5.2 mmol/L   Chloride 102 96 - 106 mmol/L   CO2 25 20 - 29 mmol/L   Calcium 9.6 8.7 - 10.2 mg/dL   Total Protein 7.2 6.0 - 8.5 g/dL   Albumin 4.5 3.8 - 4.9 g/dL  Globulin, Total 2.7 1.5 - 4.5 g/dL   Albumin/Globulin Ratio 1.7 1.2 - 2.2   Bilirubin Total 0.3 0.0 - 1.2 mg/dL   Alkaline Phosphatase 108 44 - 121 IU/L    Comment:               **Please note reference interval change**   AST 18 0 - 40 IU/L   ALT 22 0 - 44 IU/L  TSH     Status: None   Collection Time: 02/02/20  9:44 AM  Result Value Ref Range   TSH 1.570 0.450 - 4.500 uIU/mL  CBC with Differential/Platelet     Status: Abnormal   Collection Time: 02/02/20  9:45 AM  Result Value Ref Range   WBC 5.2 3.4 - 10.8 x10E3/uL   RBC 4.59 4.14 - 5.80 x10E6/uL   Hemoglobin 14.7 13.0 - 17.7 g/dL   Hematocrit 41.9 37.5 - 51.0 %   MCV 91 79 - 97 fL   MCH 32.0 26.6 - 33.0 pg   MCHC 35.1 31 - 35 g/dL   RDW 11.7 11.6 - 15.4 %   Platelets 256 150 - 450 x10E3/uL   Neutrophils 50 Not Estab. %   Lymphs 32 Not Estab. %   Monocytes 8 Not Estab. %   Eos 9 Not Estab. %   Basos 1 Not Estab. %   Neutrophils Absolute 2.6 1 - 7 x10E3/uL   Lymphocytes Absolute 1.7 0 - 3 x10E3/uL   Monocytes Absolute 0.4 0 - 0 x10E3/uL   EOS (ABSOLUTE) 0.5 (H) 0.0 - 0.4 x10E3/uL   Basophils Absolute 0.1 0 - 0 x10E3/uL   Immature Granulocytes 0 Not Estab. %   Immature Grans (Abs) 0.0 0.0 - 0.1 x10E3/uL  Lipid Panel With LDL/HDL Ratio     Status: Abnormal   Collection Time: 02/02/20  9:46 AM  Result Value Ref Range   Cholesterol, Total 264 (H) 100 - 199 mg/dL   Triglycerides 184 (H) 0 - 149 mg/dL   HDL 46 >39 mg/dL   VLDL Cholesterol Cal 34 5 - 40 mg/dL   LDL Chol Calc (NIH) 184 (H) 0 - 99 mg/dL   LDL/HDL Ratio 4.0 (H) 0.0 - 3.6 ratio    Comment:                                     LDL/HDL Ratio                                              Men  Women                               1/2 Avg.Risk  1.0    1.5                                   Avg.Risk  3.6    3.2                                2X Avg.Risk  6.2    5.0  3X Avg.Risk  8.0    6.1   POCT glycosylated hemoglobin (Hb A1C)     Status: Normal   Collection Time: 02/04/20  9:23 AM  Result Value Ref Range   Hemoglobin A1C     HbA1c POC (<> result, manual entry)     HbA1c, POC (prediabetic range)     HbA1c, POC (controlled diabetic range)    UA/M w/rflx Culture, Routine     Status: Abnormal   Collection Time: 02/04/20 10:11 AM   Specimen: Urine   Urine  Result Value Ref Range   Specific Gravity, UA 1.025 1.005 - 1.030   pH, UA 5.0 5.0 - 7.5   Color, UA Yellow Yellow   Appearance Ur Turbid (A) Clear   Leukocytes,UA Negative Negative   Protein,UA Negative Negative/Trace   Glucose, UA Negative Negative   Ketones, UA Negative Negative   RBC, UA Negative Negative   Bilirubin, UA Negative Negative   Urobilinogen, Ur 0.2 0.2 - 1.0 mg/dL   Nitrite, UA Negative Negative   Microscopic Examination Comment     Comment: Microscopic follows if indicated.   Microscopic Examination See below:     Comment: Microscopic was indicated and was performed.   Urinalysis Reflex Comment     Comment: This specimen will not reflex to a Urine Culture.  Microscopic Examination     Status: None   Collection Time: 02/04/20 10:11 AM   Urine  Result Value Ref Range   WBC, UA 0-5 0 - 5 /hpf   RBC None seen 0 - 2 /hpf   Epithelial Cells (non renal) 0-10 0 - 10 /hpf   Casts None seen None seen /lpf   Bacteria, UA None seen None seen/Few   Assessment/Plan: 1. Encounter for general adult medical examination with abnormal findings Pt is in good health, will continue to work with life style changes.   2. Mixed hyperlipidemia Start low dose Crestor 5 mg po qd   3. Impaired fasting blood sugar Pt has normal glucose and hg a1c   - POCT glycosylated hemoglobin (Hb A1C)  4. Encounter for screening colonoscopy Age related indication for Colonoscopy  - Ambulatory referral to Gastroenterology  5. Dysuria - UA/M w/rflx Culture, Routine  General Counseling: Dajour verbalizes understanding of the findings of todays visit and agrees with plan of treatment. I have discussed any further diagnostic evaluation that may be needed or ordered today. We also reviewed his medications today. he has been encouraged to call the office with any questions or concerns that should arise related to todays visit. Counseling: Cardiac risk factor modification:  1. Control blood pressure. 2. Exercise as prescribed. 3. Follow low sodium, low fat diet. and low fat and low cholestrol diet. 4. Take ASA 70m once a day. 5. Restricted calories diet to lose weight.   Orders Placed This Encounter  Procedures  . Microscopic Examination  . UA/M w/rflx Culture, Routine  . Ambulatory referral to Gastroenterology  . POCT glycosylated hemoglobin (Hb A1C)    Meds ordered this encounter  Medications  . rosuvastatin (CRESTOR) 5 MG tablet    Sig: Take 1 tablet (5 mg total) by mouth daily.    Dispense:  90 tablet    Refill:  3    Total time spent:35 Minutes  Time spent includes review of chart, medications, test results, and follow up plan with the patient.     FLavera Guise MD  Internal Medicine

## 2020-02-05 LAB — UA/M W/RFLX CULTURE, ROUTINE
Bilirubin, UA: NEGATIVE
Glucose, UA: NEGATIVE
Ketones, UA: NEGATIVE
Leukocytes,UA: NEGATIVE
Nitrite, UA: NEGATIVE
Protein,UA: NEGATIVE
RBC, UA: NEGATIVE
Specific Gravity, UA: 1.025 (ref 1.005–1.030)
Urobilinogen, Ur: 0.2 mg/dL (ref 0.2–1.0)
pH, UA: 5 (ref 5.0–7.5)

## 2020-02-05 LAB — MICROSCOPIC EXAMINATION
Bacteria, UA: NONE SEEN
Casts: NONE SEEN /lpf
RBC, Urine: NONE SEEN /hpf (ref 0–2)

## 2020-02-23 ENCOUNTER — Telehealth: Payer: No Typology Code available for payment source | Admitting: Physician Assistant

## 2020-02-23 ENCOUNTER — Other Ambulatory Visit: Payer: Self-pay | Admitting: Physician Assistant

## 2020-02-23 DIAGNOSIS — Z20822 Contact with and (suspected) exposure to covid-19: Secondary | ICD-10-CM | POA: Diagnosis not present

## 2020-02-23 MED ORDER — BENZONATATE 100 MG PO CAPS: 100.0000 mg | ORAL_CAPSULE | Freq: Three times a day (TID) | ORAL | 0 refills | Status: DC

## 2020-02-23 NOTE — Progress Notes (Signed)
E-Visit for Corona Virus Screening  Your current symptoms could be consistent with the coronavirus.  Many health care providers can now test patients at their office but not all are.  [facility] has multiple testing sites. For information on our [place] testing locations and hours go to [email or web address]  We are enrolling you in our [place] for [place] . Daily you will receive a questionnaire within the [place] website. Our COVID 19 response team will be monitoring your responses daily.  Testing Information: The COVID-19 Community Testing sites are testing BY APPOINTMENT ONLY.  You can schedule online at [email or web address]  If you do not have access to a smart phone or computer you may call [phone] for an appointment.   Additional testing sites in the Community:  . For [facility] Testing sites in [facility]  FaceUpdate.[email or web address]  . For Pop-up testing sites in [place]  BowlDirectory.co.uk  . For [facility] [email or web address]  . For [facility] testing in [place] and [employer] [email or web address]  . For [facility] testing in [facility]   [email or web address]  For  more information about community testing call [phone]   Please quarantine yourself while awaiting your test results. Please stay home for a minimum of 10 days from the first day of illness with improving symptoms and you have had 24 hours of no fever (without the use of Tylenol (Acetaminophen) Motrin (Ibuprofen) or any fever reducing medication).  Also - Do not get tested prior to returning to work because once you have had a positive test the test can stay  positive for more than a month in some cases.   You should wear a mask or cloth face covering over your nose and mouth if you must be around other people or animals, including pets (even at home). Try to stay at least 6 feet away from other people. This will protect the people around you.  Please continue good preventive care measures, including:  frequent hand-washing, avoid touching your face, cover coughs/sneezes, stay out of crowds and keep a 6 foot distance from others.  COVID-19 is a respiratory illness with symptoms that are similar to the flu. Symptoms are typically mild to moderate, but there have been cases of severe illness and death due to the virus.   The following symptoms may appear 2-14 days after exposure: . Fever . Cough . Shortness of breath or difficulty breathing . Chills . Repeated shaking with chills . Muscle pain . Headache . Sore throat . New loss of taste or smell . Fatigue . Congestion or runny nose . Nausea or vomiting . Diarrhea  Go to the nearest hospital ED for assessment if fever/cough/breathlessness are severe or illness seems like a threat to life.  It is vitally important that if you feel that you have an infection such as this virus or any other virus that you stay home and away from places where you may spread it to others.  You should avoid contact with people age 9 and older.   You can use medication such as A prescription cough medication called Tessalon Perles 100 mg. You may take 1-2 capsules every 8 hours as needed for cough  You may also take acetaminophen (Tylenol) as needed for fever.  Reduce your risk of any infection by using the same precautions used for avoiding the common cold or flu:  Marland Kitchen Wash your hands often with soap and warm water for at least 20 seconds.  If soap and water are not  readily available, use an alcohol-based hand sanitizer with at least 60% alcohol.  . If coughing or sneezing, cover your mouth and nose by coughing or  sneezing into the elbow areas of your shirt or coat, into a tissue or into your sleeve (not your hands). . Avoid shaking hands with others and consider head nods or verbal greetings only. . Avoid touching your eyes, nose, or mouth with unwashed hands.  . Avoid close contact with people who are sick. . Avoid places or events with large numbers of people in one location, like concerts or sporting events. . Carefully consider travel plans you have or are making. . If you are planning any travel outside or inside the [place], visit the CDC's Travelers' Health webpage for the latest health notices. . If you have some symptoms but not all symptoms, continue to monitor at home and seek medical attention if your symptoms worsen. . If you are having a medical emergency, call 911.  HOME CARE . Only take medications as instructed by your medical team. . Drink plenty of fluids and get plenty of rest. . A steam or ultrasonic humidifier can help if you have congestion.   GET HELP RIGHT AWAY IF YOU HAVE EMERGENCY WARNING SIGNS** FOR COVID-19. If you or someone is showing any of these signs seek emergency medical care immediately. Call 911 or proceed to your closest emergency facility if: . You develop worsening high fever. . Trouble breathing . Bluish lips or face . Persistent pain or pressure in the chest . New confusion . Inability to wake or stay awake . You cough up blood. . Your symptoms become more severe  **This list is not all possible symptoms. Contact your medical provider for any symptoms that are sever or concerning to you.  MAKE SURE YOU   Understand these instructions.  Will watch your condition.  Will get help right away if you are not doing well or get worse.  Your e-visit answers were reviewed by a board certified advanced clinical practitioner to complete your personal care plan.  Depending on the condition, your plan could have included both over the counter or prescription  medications.  If there is a problem please reply once you have received a response from your provider.  Your safety is important to us.  If you have drug allergies check your prescription carefully.    You can use MyChart to ask questions about today's visit, request a non-urgent call back, or ask for a work or school excuse for 24 hours related to this e-Visit. If it has been greater than 24 hours you will need to follow up with your provider, or enter a new e-Visit to address those concerns. You will get an e-mail in the next two days asking about your experience.  I hope that your e-visit has been valuable and will speed your recovery. Thank you for using e-visits.   Approximately 5 minutes was spent documenting and reviewing patient's chart.   

## 2020-02-24 ENCOUNTER — Telehealth (INDEPENDENT_AMBULATORY_CARE_PROVIDER_SITE_OTHER): Payer: Self-pay | Admitting: Gastroenterology

## 2020-02-24 ENCOUNTER — Other Ambulatory Visit: Payer: Self-pay

## 2020-02-24 DIAGNOSIS — Z1211 Encounter for screening for malignant neoplasm of colon: Secondary | ICD-10-CM

## 2020-02-24 MED ORDER — NA SULFATE-K SULFATE-MG SULF 17.5-3.13-1.6 GM/177ML PO SOLN: 1.0000 | Freq: Once | ORAL | 0 refills | Status: AC

## 2020-02-24 NOTE — Progress Notes (Signed)
Gastroenterology Pre-Procedure Review  Request Date: Wed 03/17/20 Requesting Physician: Dr. Marius Ditch  PATIENT REVIEW QUESTIONS: The patient responded to the following health history questions as indicated:    1. Are you having any GI issues? no 2. Do you have a personal history of Polyps? no 3. Do you have a family history of Colon Cancer or Polyps? no 4. Diabetes Mellitus? no 5. Joint replacements in the past 12 months?no 6. Major health problems in the past 3 months?yes (COVID TEST RESULTS PENDING) 7. Any artificial heart valves, MVP, or defibrillator?no    MEDICATIONS & ALLERGIES:    Patient reports the following regarding taking any anticoagulation/antiplatelet therapy:   Plavix, Coumadin, Eliquis, Xarelto, Lovenox, Pradaxa, Brilinta, or Effient? no Aspirin? no  Patient confirms/reports the following medications:  Current Outpatient Medications  Medication Sig Dispense Refill  . benzonatate (TESSALON) 100 MG capsule Take 1 capsule (100 mg total) by mouth every 8 (eight) hours for 5 days. 15 capsule 0  . Na Sulfate-K Sulfate-Mg Sulf 17.5-3.13-1.6 GM/177ML SOLN Take 1 kit by mouth once for 1 dose. 354 mL 0  . rosuvastatin (CRESTOR) 5 MG tablet Take 1 tablet (5 mg total) by mouth daily. (Patient not taking: Reported on 02/24/2020) 90 tablet 3  . tamsulosin (FLOMAX) 0.4 MG CAPS capsule Take 1 capsule (0.4 mg total) by mouth daily. (Patient not taking: Reported on 02/04/2020) 30 capsule 3   No current facility-administered medications for this visit.    Patient confirms/reports the following allergies:  No Known Allergies  No orders of the defined types were placed in this encounter.   AUTHORIZATION INFORMATION Primary Insurance: 1D#: Group #:  Secondary Insurance: 1D#: Group #:  SCHEDULE INFORMATION: Date: Wed 03/17/20 Time: Location:[facility]

## 2020-02-29 ENCOUNTER — Encounter: Payer: Self-pay | Admitting: Gastroenterology

## 2020-03-02 ENCOUNTER — Emergency Department: Payer: No Typology Code available for payment source

## 2020-03-02 ENCOUNTER — Other Ambulatory Visit: Payer: Self-pay

## 2020-03-02 ENCOUNTER — Emergency Department
Admission: EM | Admit: 2020-03-02 | Discharge: 2020-03-02 | Disposition: A | Discharge status: Home / Self Care | Payer: No Typology Code available for payment source | Attending: Emergency Medicine | Admitting: Emergency Medicine

## 2020-03-02 ENCOUNTER — Ambulatory Visit (INDEPENDENT_AMBULATORY_CARE_PROVIDER_SITE_OTHER): Payer: No Typology Code available for payment source | Admitting: Hospice and Palliative Medicine

## 2020-03-02 ENCOUNTER — Encounter: Payer: Self-pay | Admitting: Hospice and Palliative Medicine

## 2020-03-02 DIAGNOSIS — Z87891 Personal history of nicotine dependence: Secondary | ICD-10-CM | POA: Insufficient documentation

## 2020-03-02 DIAGNOSIS — U071 COVID-19: Secondary | ICD-10-CM | POA: Insufficient documentation

## 2020-03-02 DIAGNOSIS — R112 Nausea with vomiting, unspecified: Secondary | ICD-10-CM | POA: Diagnosis present

## 2020-03-02 DIAGNOSIS — E860 Dehydration: Secondary | ICD-10-CM | POA: Diagnosis not present

## 2020-03-02 LAB — BASIC METABOLIC PANEL
Anion gap: 11 (ref 5–15)
BUN: 18 mg/dL (ref 6–20)
CO2: 26 mmol/L (ref 22–32)
Calcium: 8.6 mg/dL — ABNORMAL LOW (ref 8.9–10.3)
Chloride: 95 mmol/L — ABNORMAL LOW (ref 98–111)
Creatinine, Ser: 1.26 mg/dL — ABNORMAL HIGH (ref 0.61–1.24)
GFR, Estimated: >60 mL/min (ref >60)
Glucose, Bld: 122 mg/dL — ABNORMAL HIGH (ref 70–99)
Potassium: 4.4 mmol/L (ref 3.5–5.1)
Sodium: 132 mmol/L — ABNORMAL LOW (ref 135–145)

## 2020-03-02 LAB — CBC
HCT: 43.9 % (ref 39.0–52.0)
Hemoglobin: 14.6 g/dL (ref 13.0–17.0)
MCH: 31 pg (ref 26.0–34.0)
MCHC: 33.3 g/dL (ref 30.0–36.0)
MCV: 93.2 fL (ref 80.0–100.0)
Platelets: 191 10*3/uL (ref 150–400)
RBC: 4.71 MIL/uL (ref 4.22–5.81)
RDW: 12.2 % (ref 11.5–15.5)
WBC: 5.5 10*3/uL (ref 4.0–10.5)
nRBC: 0 % (ref 0.0–0.2)

## 2020-03-02 MED ORDER — ONDANSETRON HCL 4 MG/2ML IJ SOLN
4.0000 mg | Freq: Once | INTRAMUSCULAR | Status: AC
  Administered 2020-03-02: 4 mg via INTRAVENOUS
  Filled 2020-03-02: qty 2

## 2020-03-02 MED ORDER — KETOROLAC TROMETHAMINE 30 MG/ML IJ SOLN
15.0000 mg | Freq: Once | INTRAMUSCULAR | Status: AC
  Administered 2020-03-02: 15 mg via INTRAVENOUS
  Filled 2020-03-02: qty 1

## 2020-03-02 MED ORDER — LACTATED RINGERS IV BOLUS
1000.0000 mL | Freq: Once | INTRAVENOUS | Status: AC
  Administered 2020-03-02: 1000 mL via INTRAVENOUS

## 2020-03-02 MED ORDER — PROMETHAZINE HCL 12.5 MG PO TABS: 12.5000 mg | ORAL_TABLET | Freq: Four times a day (QID) | ORAL | 0 refills | Status: DC | PRN

## 2020-03-02 NOTE — ED Triage Notes (Addendum)
Pt arrives via pov from home reports covid + on 10/4. Pt reports having headaches, back pain, emesis, feeling weaker than normal. NAD noted in triage. Pt reports it feels hard to breathe, RR even and unlabored with good oxygen saturation at this time

## 2020-03-02 NOTE — ED Provider Notes (Signed)
[facility] Emergency Department Provider Note   ____________________________________________   First MD Initiated Contact with Patient 03/02/20 1702     (approximate)  I have reviewed the triage vital signs and the nursing notes.   HISTORY  Chief Complaint Headache, Fatigue, and Emesis    HPI Kristopher Osborne is a 49 y.o. male with past medical history of hyperlipidemia who presents to the ED complaining of nausea and vomiting.  Patient reports that he initially tested positive for COVID-19 in a drive-through testing center on 10/4.  Over the past few days, he has had persistent nausea and vomiting and has been unable to keep anything down.  He had a couple of days of diarrhea that has since resolved and currently denies any abdominal pain.  He has some soreness in his chest when he goes to cough, but he currently denies any shortness of breath and has not had fevers recently.  He had tried Zofran at home for his symptoms, but vomited this up when he took it last night.        Past Medical History:  Diagnosis Date  . Hypercholesteremia     Patient Active Problem List   Diagnosis Date Noted  . Impaired fasting blood sugar 02/04/2020  . Calculus of gallbladder without cholecystitis without obstruction 12/27/2018  . Kidney stone on left side 12/27/2018  . Hydronephrosis with urinary obstruction due to renal calculus 12/27/2018  . Actinic keratosis 01/23/2018  . Hyperlipidemia 12/19/2017    Past Surgical History:  Procedure Laterality Date  . FOOT SURGERY Left 2012    Prior to Admission medications   Medication Sig Start Date End Date Taking? Authorizing Provider  rosuvastatin (CRESTOR) 5 MG tablet Take 1 tablet (5 mg total) by mouth daily. Patient not taking: Reported on 02/24/2020 02/04/20   Lavera Guise, MD  tamsulosin [facility]) 0.4 MG CAPS capsule Take 1 capsule (0.4 mg total) by mouth daily. Patient not taking: Reported on 02/04/2020 12/24/19    Lavera Guise, MD    Allergies Patient has no known allergies.  Family History  Problem Relation Age of Onset  . Hepatitis C Father   . Stroke Maternal Grandmother   . Stroke Paternal Grandmother     Social History Social History   Tobacco Use  . Smoking status: Former Smoker    Packs/day: 0.50    Years: 7.00    Pack years: 3.50    Types: Cigarettes    Start date: 05/23/1987    Quit date: 08/01/1994    Years since quitting: 25.6  . Smokeless tobacco: Never Used  Vaping Use  . Vaping Use: Never used  Substance Use Topics  . Alcohol use: Yes    Alcohol/week: 3.0 standard drinks    Types: 3 Cans of beer per week    Comment: daily  . Drug use: No    Review of Systems  Constitutional: No fever/chills Eyes: No visual changes. ENT: No sore throat. Cardiovascular: Denies chest pain. Respiratory: Denies shortness of breath.  Positive for cough. Gastrointestinal: No abdominal pain.  Positive for nausea and vomiting.  No diarrhea.  No constipation. Genitourinary: Negative for dysuria. Musculoskeletal: Negative for back pain. Skin: Negative for rash. Neurological: Negative for headaches, focal weakness or numbness.  ____________________________________________   PHYSICAL EXAM:  VITAL SIGNS: ED Triage Vitals  Enc Vitals Group     BP 03/02/20 1154 115/67     Pulse Rate 03/02/20 1154 94     Resp 03/02/20 1154 20  Temp 03/02/20 1154 99.3 F (37.4 C)     Temp src --      SpO2 03/02/20 1154 97 %     Weight 03/02/20 1155 210 lb (95.3 kg)     Height 03/02/20 1155 6\' 3"  (1.905 m)     Head Circumference --      Peak Flow --      Pain Score 03/02/20 1155 10     Pain Loc --      Pain Edu? --      Excl. in [place]? --     Constitutional: Alert and oriented. Eyes: Conjunctivae are normal. Head: Atraumatic. Nose: No congestion/rhinnorhea. Mouth/Throat: Mucous membranes are dry. Neck: Normal ROM Cardiovascular: Normal rate, regular rhythm. Grossly normal heart  sounds. Respiratory: Normal respiratory effort.  No retractions. Lungs CTAB. Gastrointestinal: Soft and nontender. No distention. Genitourinary: deferred Musculoskeletal: No lower extremity tenderness nor edema. Neurologic:  Normal speech and language. No gross focal neurologic deficits are appreciated. Skin:  Skin is warm, dry and intact. No rash noted. Psychiatric: Mood and affect are normal. Speech and behavior are normal.  ____________________________________________   LABS (all labs ordered are listed, but only abnormal results are displayed)  Labs Reviewed  BASIC METABOLIC PANEL - Abnormal; Notable for the following components:      Result Value   Sodium 132 (*)    Chloride 95 (*)    Glucose, Bld 122 (*)    Creatinine, Ser 1.26 (*)    Calcium 8.6 (*)    All other components within normal limits  CBC  CBG MONITORING, ED   ____________________________________________  EKG  ED ECG REPORT I, Blake Divine, the attending physician, personally viewed and interpreted this ECG.   Date: 03/02/2020  EKG Time: 11:53  Rate: 96  Rhythm: normal sinus rhythm  Axis: Normal  Intervals:none  ST&T Change: None   PROCEDURES  Procedure(s) performed (including Critical Care):  Procedures   ____________________________________________   INITIAL IMPRESSION / ASSESSMENT AND PLAN / ED COURSE       49 year old male with past medical history of hyperlipidemia who presents to the ED complaining of persistent nausea and vomiting since he was diagnosed with COVID-19 8 days ago.  He is not in any respiratory distress and is maintaining O2 sats on room air.  Chest x-ray and labs are reassuring.  He does appear dehydrated and we will give IV fluid bolus, treat with IV Zofran.  We will also treat his diffuse muscle aches with Toradol.  EKG shows no evidence of arrhythmia or ischemia.  Patient reports symptoms are improved following Zofran and Toradol.  He was able to tolerate p.o.  without difficulty and lab work is reassuring.  He continues to deny any respiratory symptoms and is appropriate for discharge home.  Will prescribe Phenergan for his nausea and vomiting, he was counseled to return to the ED for new or worsening symptoms.  Patient agrees with plan.      ____________________________________________   FINAL CLINICAL IMPRESSION(S) / ED DIAGNOSES  Final diagnoses:  COVID-19  Non-intractable vomiting with nausea, unspecified vomiting type     ED Discharge Orders    None       Note:  This document was prepared using Dragon voice recognition software and may include unintentional dictation errors.   Blake Divine, MD 03/02/20 1949

## 2020-03-02 NOTE — Progress Notes (Signed)
[facility] [place], [place] [address]  Internal MEDICINE  Telephone Visit  Patient Name: Kristopher Osborne  [id number]  [id number]  Date of Service: 03/02/2020  I connected with the patient at 0948 by telephone and verified the patients identity using two identifiers.   I discussed the limitations, risks, security and privacy concerns of performing an evaluation and management service by telephone and the availability of in person appointments. I also discussed with the patient that there may be a patient responsible charge related to the service.  The patient expressed understanding and agrees to proceed.    Chief Complaint  Patient presents with  . Telephone Screen    [phone]  . Telephone Assessment    covid  positive  . Cough  . Sinusitis  . Headache  . Vomiting  . Fever  . Hyperlipidemia    HPI Tested positive for COVID October 6th He has been feeling terrible, fevers, vomiting, cough dry, intense headaches, body aches No trouble breathing Has been taking OTC acetaminophen and ibuprofen to help with fevers He has not been able to eat much or drink very much since his diagnosis due to intense nausea and vomiting Fever Tmax 102 He tried taking Zofran but immediately had to vomit He has been having these symptoms since October 3rd and they have progressively worsened High risk for dehydration due to poor PO intake and vomiting    Current Medication: Outpatient Encounter Medications as of 03/02/2020  Medication Sig  . rosuvastatin (CRESTOR) 5 MG tablet Take 1 tablet (5 mg total) by mouth daily. (Patient not taking: Reported on 02/24/2020)  . tamsulosin (FLOMAX) 0.4 MG CAPS capsule Take 1 capsule (0.4 mg total) by mouth daily. (Patient not taking: Reported on 02/04/2020)   No facility-administered encounter medications on file as of 03/02/2020.    Surgical History: Past Surgical History:  Procedure Laterality Date  . FOOT SURGERY Left  2012    Medical History: Past Medical History:  Diagnosis Date  . Hypercholesteremia     Family History: Family History  Problem Relation Age of Onset  . Hepatitis C Father   . Stroke Maternal Grandmother   . Stroke Paternal Grandmother     Social History   Socioeconomic History  . Marital status: Married    Spouse name: Helene Kelp   . Number of children: 1  . Years of education: Not on file  . Highest education level: GED or equivalent  Occupational History  . Occupation: Dealer   Tobacco Use  . Smoking status: Former Smoker    Packs/day: 0.50    Years: 7.00    Pack years: 3.50    Types: Cigarettes    Start date: 05/23/1987    Quit date: 08/01/1994    Years since quitting: 25.6  . Smokeless tobacco: Never Used  Vaping Use  . Vaping Use: Never used  Substance and Sexual Activity  . Alcohol use: Yes    Alcohol/week: 3.0 standard drinks    Types: 3 Cans of beer per week    Comment: daily  . Drug use: No  . Sexual activity: Not on file  Other Topics Concern  . Not on file  Social History Narrative  . Not on file   Social Determinants of Health   Financial Resource Strain:   . Difficulty of Paying Living Expenses: Not on file  Food Insecurity:   . Worried About Charity fundraiser in the Last Year: Not on file  . Ran Out  of Food in the Last Year: Not on file  Transportation Needs:   . Lack of Transportation (Medical): Not on file  . Lack of Transportation (Non-Medical): Not on file  Physical Activity:   . Days of Exercise per Week: Not on file  . Minutes of Exercise per Session: Not on file  Stress:   . Feeling of Stress : Not on file  Social Connections:   . Frequency of Communication with Friends and Family: Not on file  . Frequency of Social Gatherings with Friends and Family: Not on file  . Attends Religious Services: Not on file  . Active Member of Clubs or Organizations: Not on file  . Attends Archivist Meetings: Not on file  . Marital  Status: Not on file  Intimate Partner Violence:   . Fear of Current or Ex-Partner: Not on file  . Emotionally Abused: Not on file  . Physically Abused: Not on file  . Sexually Abused: Not on file    Review of Systems  Constitutional: Positive for activity change, appetite change, chills, diaphoresis, fatigue and fever. Negative for unexpected weight change.  HENT: Negative for congestion, postnasal drip, rhinorrhea, sneezing and sore throat.   Eyes: Negative for photophobia, redness and visual disturbance.  Respiratory: Negative for cough, chest tightness and shortness of breath.   Cardiovascular: Negative for chest pain, palpitations and leg swelling.  Gastrointestinal: Positive for nausea and vomiting. Negative for abdominal pain, constipation and diarrhea.  Genitourinary: Negative for dysuria and frequency.  Musculoskeletal: Positive for arthralgias and myalgias. Negative for back pain, joint swelling and neck pain.  Skin: Negative for rash.  Neurological: Negative for tremors and numbness.  Hematological: Negative for adenopathy. Does not bruise/bleed easily.  Psychiatric/Behavioral: Negative for behavioral problems (Depression), sleep disturbance and suicidal ideas. The patient is not nervous/anxious.     Vital Signs: Temp 98.1 F (36.7 C)   Ht 6\' 3"  (1.905 m)   Wt 210 lb (95.3 kg)   BMI 26.25 kg/m    Observation/Objective: Patient acutely ill, no acute distress.  Assessment/Plan: 1. COVID-19 virus infection Symptoms started Sunday October 3rd, tested positive Wednesday October 6th, symptoms consist of intense headaches, body aches, fevers, N/V Since symptoms started he has had poor PO intake TMax 102--has been treating his fevers with Tylenol and alternating with Ibuprofen   2. Acute dehydration High risk for dehydration--advised to be seen by hospital for possible IV fluids Wife as well as patient agreed to go to hospital to be evaluated  General Counseling:  Jon Gills understanding of the findings of today's phone visit and agrees with plan of treatment. I have discussed any further diagnostic evaluation that may be needed or ordered today. We also reviewed his medications today. he has been encouraged to call the office with any questions or concerns that should arise related to todays visit.   Time spent: [place]. Clif Serio AGNP-C Internal medicine

## 2020-03-03 ENCOUNTER — Encounter: Payer: Self-pay | Admitting: Internal Medicine

## 2020-03-17 ENCOUNTER — Ambulatory Visit: Admit: 2020-03-17 | Payer: No Typology Code available for payment source | Admitting: Gastroenterology

## 2020-03-17 SURGERY — COLONOSCOPY WITH PROPOFOL
Anesthesia: General

## 2020-06-21 ENCOUNTER — Ambulatory Visit (INDEPENDENT_AMBULATORY_CARE_PROVIDER_SITE_OTHER): Payer: No Typology Code available for payment source | Admitting: Hospice and Palliative Medicine

## 2020-06-21 ENCOUNTER — Encounter: Payer: Self-pay | Admitting: Hospice and Palliative Medicine

## 2020-06-21 VITALS — BP 108/80 | HR 90 | Temp 97.9°F | Resp 16 | Ht 75.0 in | Wt 212.6 lb

## 2020-06-21 DIAGNOSIS — R109 Unspecified abdominal pain: Secondary | ICD-10-CM

## 2020-06-21 DIAGNOSIS — Z1211 Encounter for screening for malignant neoplasm of colon: Secondary | ICD-10-CM | POA: Diagnosis not present

## 2020-06-21 DIAGNOSIS — Z7289 Other problems related to lifestyle: Secondary | ICD-10-CM

## 2020-06-21 DIAGNOSIS — M79672 Pain in left foot: Secondary | ICD-10-CM | POA: Diagnosis not present

## 2020-06-21 DIAGNOSIS — Z789 Other specified health status: Secondary | ICD-10-CM

## 2020-06-21 NOTE — Progress Notes (Signed)
[facility] [place], [place] [address]  Internal MEDICINE  Office Visit Note  Patient Name: Kristopher Osborne  [id number]  [id number]  Date of Service: 06/23/2020  Chief Complaint  Patient presents with  . Hospitalization Follow-up  . Hyperlipidemia    HPI Patient is here for routine follow-up Seen in emergency department 03/02/20 for nausea and vomiting secondary to COVID19 Symptoms have resolved at this time  Prescribed rosuvastatin at last visit--he has not started taking this medication He does not believe he needs this medicine and afraid of side effects  C/o of left sided abdominal/rib pain as well as left heel pain Both have been ongoing for a few weeks and seem to be getting worse Abdominal pain does not seem to be associated with food--history of kidney stones, started on tamsulosin at last visit, has stopped taking as it was not helping with his pain Does report alcohol intake--averages 3-4 beers per night with occasional liquor  Colonoscopy was cancelled due to him being positive for COVID--will need to reschedule   Current Medication: Outpatient Encounter Medications as of 06/21/2020  Medication Sig  . promethazine (PHENERGAN) 12.5 MG tablet Take 1 tablet (12.5 mg total) by mouth every 6 (six) hours as needed for nausea or vomiting.  . rosuvastatin (CRESTOR) 5 MG tablet Take 1 tablet (5 mg total) by mouth daily.  . [DISCONTINUED] tamsulosin (FLOMAX) 0.4 MG CAPS capsule Take 1 capsule (0.4 mg total) by mouth daily.   No facility-administered encounter medications on file as of 06/21/2020.    Surgical History: Past Surgical History:  Procedure Laterality Date  . FOOT SURGERY Left 2012    Medical History: Past Medical History:  Diagnosis Date  . Hypercholesteremia     Family History: Family History  Problem Relation Age of Onset  . Hepatitis C Father   . Stroke Maternal Grandmother   . Stroke Paternal Grandmother      Social History   Socioeconomic History  . Marital status: Married    Spouse name: Helene Kelp   . Number of children: 1  . Years of education: Not on file  . Highest education level: GED or equivalent  Occupational History  . Occupation: Dealer   Tobacco Use  . Smoking status: Former Smoker    Packs/day: 0.50    Years: 7.00    Pack years: 3.50    Types: Cigarettes    Start date: 05/23/1987    Quit date: 08/01/1994    Years since quitting: 25.9  . Smokeless tobacco: Never Used  Vaping Use  . Vaping Use: Never used  Substance and Sexual Activity  . Alcohol use: Yes    Alcohol/week: 3.0 standard drinks    Types: 3 Cans of beer per week    Comment: daily  . Drug use: No  . Sexual activity: Not on file  Other Topics Concern  . Not on file  Social History Narrative  . Not on file   Social Determinants of Health   Financial Resource Strain: Not on file  Food Insecurity: Not on file  Transportation Needs: Not on file  Physical Activity: Not on file  Stress: Not on file  Social Connections: Not on file  Intimate Partner Violence: Not on file      Review of Systems  Constitutional: Negative for chills, fatigue and unexpected weight change.  HENT: Negative for congestion, postnasal drip, rhinorrhea, sneezing and sore throat.   Eyes: Negative for redness.  Respiratory: Negative for cough, chest tightness and  shortness of breath.   Cardiovascular: Negative for chest pain and palpitations.  Gastrointestinal: Positive for abdominal pain. Negative for constipation, diarrhea, nausea and vomiting.  Genitourinary: Negative for dysuria and frequency.  Musculoskeletal: Negative for arthralgias, back pain, joint swelling and neck pain.       Left heel pain  Skin: Negative for rash.  Neurological: Negative for tremors and numbness.  Hematological: Negative for adenopathy. Does not bruise/bleed easily.  Psychiatric/Behavioral: Negative for behavioral problems (Depression), sleep  disturbance and suicidal ideas. The patient is not nervous/anxious.     Vital Signs: BP 108/80   Pulse 90   Temp 97.9 F (36.6 C)   Resp 16   Ht 6\' 3"  (1.905 m)   Wt 212 lb 9.6 oz (96.4 kg)   SpO2 97%   BMI 26.57 kg/m    Physical Exam Vitals reviewed.  Constitutional:      Appearance: Normal appearance. He is normal weight.  Cardiovascular:     Rate and Rhythm: Normal rate and regular rhythm.     Pulses: Normal pulses.     Heart sounds: Normal heart sounds.  Pulmonary:     Effort: Pulmonary effort is normal.     Breath sounds: Normal breath sounds.  Abdominal:     General: Abdomen is flat.  Musculoskeletal:        General: Normal range of motion.     Cervical back: Normal range of motion.  Skin:    General: Skin is warm.  Neurological:     General: No focal deficit present.     Mental Status: He is alert and oriented to person, place, and time. Mental status is at baseline.  Psychiatric:        Mood and Affect: Mood normal.        Behavior: Behavior normal.        Thought Content: Thought content normal.        Judgment: Judgment normal.    Assessment/Plan: 1. Left sided abdominal pain [place] for further evaluation--adjust plan as indicated - US Abdomen Complete; Future  2. Pain of left heel Will obtain xray Gout/plantar fascitis/bone spur? - DG Foot Complete Left; Future - DG Foot Complete Left  3. Screening for colon cancer - Ambulatory referral to Gastroenterology  4. Alcohol use Discussed importance of weaning down his alcohol intake and the risks associated with chronic alcohol intake such as pancreatitis and increased risk of cancer  General Counseling: Bardia verbalizes understanding of the findings of todays visit and agrees with plan of treatment. I have discussed any further diagnostic evaluation that may be needed or ordered today. We also reviewed his medications today. he has been encouraged to call the office with any questions or concerns that  should arise related to todays visit.    Orders Placed This Encounter  Procedures  . US Abdomen Complete  . DG Foot Complete Left  . Ambulatory referral to Gastroenterology    Time spent:30 Minutes Time spent includes review of chart, medications, test results and follow-up plan with the patient.  This patient was seen by Theodoro Grist AGNP-C in Collaboration with Dr Lavera Guise as a part of collaborative care agreement     Tanna Furry. Gurshan Settlemire AGNP-C Internal medicine

## 2020-06-22 ENCOUNTER — Encounter: Payer: Self-pay | Admitting: Gastroenterology

## 2020-06-23 ENCOUNTER — Encounter: Payer: Self-pay | Admitting: Hospice and Palliative Medicine

## 2020-06-24 ENCOUNTER — Encounter: Payer: Self-pay | Admitting: Hospice and Palliative Medicine

## 2020-06-29 ENCOUNTER — Other Ambulatory Visit: Payer: Self-pay

## 2020-06-29 ENCOUNTER — Telehealth (INDEPENDENT_AMBULATORY_CARE_PROVIDER_SITE_OTHER): Payer: Self-pay | Admitting: Gastroenterology

## 2020-06-29 DIAGNOSIS — Z1211 Encounter for screening for malignant neoplasm of colon: Secondary | ICD-10-CM

## 2020-06-29 MED ORDER — NA SULFATE-K SULFATE-MG SULF 17.5-3.13-1.6 GM/177ML PO SOLN: 1.0000 | Freq: Once | ORAL | 0 refills | Status: DC

## 2020-06-29 NOTE — Progress Notes (Signed)
Gastroenterology Pre-Procedure Review  Request Date: Monday 07/19/20 Requesting Physician: Dr. Marius Ditch  PATIENT REVIEW QUESTIONS: The patient responded to the following health history questions as indicated:    1. Are you having any GI issues? no 2. Do you have a personal history of Polyps? no 3. Do you have a family history of Colon Cancer or Polyps? no 4. Diabetes Mellitus? no 5. Joint replacements in the past 12 months?no 6. Major health problems in the past 3 months?no 7. Any artificial heart valves, MVP, or defibrillator?no    MEDICATIONS & ALLERGIES:    Patient reports the following regarding taking any anticoagulation/antiplatelet therapy:   Plavix, Coumadin, Eliquis, Xarelto, Lovenox, Pradaxa, Brilinta, or Effient? no Aspirin? no  Patient confirms/reports the following medications:  Current Outpatient Medications  Medication Sig Dispense Refill  . promethazine (PHENERGAN) 12.5 MG tablet Take 1 tablet (12.5 mg total) by mouth every 6 (six) hours as needed for nausea or vomiting. (Patient not taking: Reported on 06/29/2020) 15 tablet 0  . rosuvastatin (CRESTOR) 5 MG tablet Take 1 tablet (5 mg total) by mouth daily. (Patient not taking: Reported on 06/29/2020) 90 tablet 3   No current facility-administered medications for this visit.    Patient confirms/reports the following allergies:  No Known Allergies  Orders Placed This Encounter  Procedures  . Procedural/ Surgical Case Request: COLONOSCOPY WITH PROPOFOL    Standing Status:   Standing    Number of Occurrences:   1    Order Specific Question:   Pre-op diagnosis    Answer:   screening colonoscopy    Order Specific Question:   CPT Code    Answer:   21308    AUTHORIZATION INFORMATION Primary Insurance: 1D#: Group #:  Secondary Insurance: 1D#: Group #:  SCHEDULE INFORMATION: Date: 07/19/20 Time: Location:[facility]

## 2020-07-05 ENCOUNTER — Other Ambulatory Visit: Payer: Self-pay

## 2020-07-05 ENCOUNTER — Ambulatory Visit
Admission: RE | Admit: 2020-07-05 | Discharge: 2020-07-05 | Disposition: A | Discharge status: Home / Self Care | Payer: No Typology Code available for payment source | Source: Ambulatory Visit | Attending: Hospice and Palliative Medicine | Admitting: Hospice and Palliative Medicine

## 2020-07-05 DIAGNOSIS — M79672 Pain in left foot: Secondary | ICD-10-CM | POA: Diagnosis not present

## 2020-07-07 ENCOUNTER — Telehealth: Payer: Self-pay

## 2020-07-07 NOTE — Telephone Encounter (Signed)
Poke with pt, informed him left foot exam doesn't show evidence of a fracture and that if pain continues we will send him to the appropriate specialist.

## 2020-07-07 NOTE — Telephone Encounter (Signed)
-----   Message from Luiz Ochoa, NP sent at 07/07/2020 10:20 AM EST ----- Please call Mr. Kristopher Osborne, left foot exam does not show evidence of fracture. Advise him that if pain continues to let us know and we will send him to appropriate specialist.

## 2020-07-07 NOTE — Progress Notes (Signed)
Please call Kristopher Osborne, left foot exam does not show evidence of fracture. Advise him that if pain continues to let us know and we will send him to appropriate specialist.

## 2020-07-09 ENCOUNTER — Telehealth: Payer: Self-pay

## 2020-07-09 ENCOUNTER — Other Ambulatory Visit: Payer: Self-pay | Admitting: Hospice and Palliative Medicine

## 2020-07-09 ENCOUNTER — Encounter: Payer: Self-pay | Admitting: Hospice and Palliative Medicine

## 2020-07-09 DIAGNOSIS — M79672 Pain in left foot: Secondary | ICD-10-CM

## 2020-07-09 NOTE — Telephone Encounter (Signed)
LMOM that Kristopher Osborne placed a referral to podiatrist and will call with an appt.

## 2020-07-15 ENCOUNTER — Other Ambulatory Visit: Payer: Self-pay

## 2020-07-15 ENCOUNTER — Other Ambulatory Visit
Admission: RE | Admit: 2020-07-15 | Discharge: 2020-07-15 | Disposition: A | Discharge status: Home / Self Care | Payer: No Typology Code available for payment source | Source: Ambulatory Visit | Attending: Gastroenterology | Admitting: Gastroenterology

## 2020-07-15 DIAGNOSIS — Z01812 Encounter for preprocedural laboratory examination: Secondary | ICD-10-CM | POA: Insufficient documentation

## 2020-07-15 DIAGNOSIS — Z20822 Contact with and (suspected) exposure to covid-19: Secondary | ICD-10-CM | POA: Insufficient documentation

## 2020-07-16 ENCOUNTER — Encounter: Payer: Self-pay | Admitting: Gastroenterology

## 2020-07-16 ENCOUNTER — Other Ambulatory Visit: Payer: No Typology Code available for payment source

## 2020-07-16 LAB — SARS CORONAVIRUS 2 (TAT 6-24 HRS): SARS Coronavirus 2: NEGATIVE

## 2020-07-19 ENCOUNTER — Encounter: Payer: Self-pay | Admitting: Gastroenterology

## 2020-07-19 ENCOUNTER — Ambulatory Visit: Payer: No Typology Code available for payment source | Admitting: Certified Registered"

## 2020-07-19 ENCOUNTER — Ambulatory Visit
Admission: RE | Admit: 2020-07-19 | Discharge: 2020-07-19 | Disposition: A | Discharge status: Home / Self Care | Payer: No Typology Code available for payment source | Attending: Gastroenterology | Admitting: Gastroenterology

## 2020-07-19 ENCOUNTER — Encounter
Admission: RE | Disposition: A | Discharge status: Home / Self Care | Payer: Self-pay | Source: Home / Self Care | Attending: Gastroenterology

## 2020-07-19 DIAGNOSIS — D128 Benign neoplasm of rectum: Secondary | ICD-10-CM | POA: Diagnosis not present

## 2020-07-19 DIAGNOSIS — K644 Residual hemorrhoidal skin tags: Secondary | ICD-10-CM | POA: Insufficient documentation

## 2020-07-19 DIAGNOSIS — Z87891 Personal history of nicotine dependence: Secondary | ICD-10-CM | POA: Insufficient documentation

## 2020-07-19 DIAGNOSIS — K635 Polyp of colon: Secondary | ICD-10-CM | POA: Diagnosis not present

## 2020-07-19 DIAGNOSIS — K5730 Diverticulosis of large intestine without perforation or abscess without bleeding: Secondary | ICD-10-CM | POA: Insufficient documentation

## 2020-07-19 DIAGNOSIS — Z1211 Encounter for screening for malignant neoplasm of colon: Secondary | ICD-10-CM

## 2020-07-19 HISTORY — DX: Nausea with vomiting, unspecified: R11.2

## 2020-07-19 HISTORY — PX: COLONOSCOPY WITH PROPOFOL: SHX5780

## 2020-07-19 HISTORY — DX: Other specified postprocedural states: Z98.890

## 2020-07-19 SURGERY — COLONOSCOPY WITH PROPOFOL
Anesthesia: General

## 2020-07-19 MED ORDER — PROPOFOL 500 MG/50ML IV EMUL
INTRAVENOUS | Status: DC | PRN
  Administered 2020-07-19: 125 ug/kg/min via INTRAVENOUS

## 2020-07-19 MED ORDER — PROPOFOL 10 MG/ML IV BOLUS
INTRAVENOUS | Status: DC | PRN
  Administered 2020-07-19 (×2): 50 mg via INTRAVENOUS
  Administered 2020-07-19: 30 mg via INTRAVENOUS

## 2020-07-19 MED ORDER — LIDOCAINE HCL (CARDIAC) PF 100 MG/5ML IV SOSY
PREFILLED_SYRINGE | INTRAVENOUS | Status: DC | PRN
  Administered 2020-07-19: 50 mg via INTRAVENOUS

## 2020-07-19 MED ORDER — SODIUM CHLORIDE 0.9 % IV SOLN: INTRAVENOUS | Status: DC

## 2020-07-19 MED ORDER — PROPOFOL 10 MG/ML IV BOLUS
INTRAVENOUS | Status: AC
  Filled 2020-07-19: qty 20

## 2020-07-19 NOTE — Anesthesia Procedure Notes (Signed)
Procedure Name: MAC Date/Time: 07/19/2020 10:04 AM Performed by: Jerrye Noble, CRNA Pre-anesthesia Checklist: Patient identified, Emergency Drugs available, Suction available and Patient being monitored Patient Re-evaluated:Patient Re-evaluated prior to induction Oxygen Delivery Method: Nasal cannula

## 2020-07-19 NOTE — Transfer of Care (Signed)
Immediate Anesthesia Transfer of Care Note  Patient: Kristopher Osborne  Procedure(s) Performed: COLONOSCOPY WITH PROPOFOL (N/A )  Patient Location: PACU and Endoscopy Unit  Anesthesia Type:General  Level of Consciousness: drowsy  Airway & Oxygen Therapy: Patient Spontanous Breathing  Post-op Assessment: Report given to RN and Post -op Vital signs reviewed and stable  Post vital signs: Reviewed and stable  Last Vitals:  Vitals Value Taken Time  BP 106/71 07/19/20 1034  Temp 36.3 C 07/19/20 1034  Pulse 72 07/19/20 1034  Resp 13 07/19/20 1034  SpO2 97 % 07/19/20 1034  Vitals shown include unvalidated device data.  Last Pain:  Vitals:   07/19/20 1034  TempSrc: Temporal  PainSc:          Complications: No complications documented.

## 2020-07-19 NOTE — H&P (Signed)
Cephas Darby, MD [address]  [place]  [place], [place] [address]  Main: [phone]  Fax: [phone] Pager: [phone]  Primary Care Physician:  Lavera Guise, MD Primary Gastroenterologist:  Dr. Cephas Darby  Pre-Procedure History & Physical: HPI:  Kristopher Osborne is a 50 y.o. male is here for an colonoscopy.   Past Medical History:  Diagnosis Date  . Hypercholesteremia   . PONV (postoperative nausea and vomiting)     Past Surgical History:  Procedure Laterality Date  . FOOT SURGERY Left 2012    Prior to Admission medications   Medication Sig Start Date End Date Taking? Authorizing Provider  promethazine (PHENERGAN) 12.5 MG tablet Take 1 tablet (12.5 mg total) by mouth every 6 (six) hours as needed for nausea or vomiting. Patient not taking: Reported on 06/29/2020 03/02/20   Blake Divine, MD  rosuvastatin (CRESTOR) 5 MG tablet Take 1 tablet (5 mg total) by mouth daily. Patient not taking: Reported on 06/29/2020 02/04/20   Lavera Guise, MD    Allergies as of 06/29/2020  . (No Known Allergies)    Family History  Problem Relation Age of Onset  . Hepatitis C Father   . Stroke Maternal Grandmother   . Stroke Paternal Grandmother     Social History   Socioeconomic History  . Marital status: Married    Spouse name: Helene Kelp   . Number of children: 1  . Years of education: Not on file  . Highest education level: GED or equivalent  Occupational History  . Occupation: Dealer   Tobacco Use  . Smoking status: Former Smoker    Packs/day: 0.50    Years: 7.00    Pack years: 3.50    Types: Cigarettes    Start date: 05/23/1987    Quit date: 08/01/1994    Years since quitting: 25.9  . Smokeless tobacco: Never Used  Vaping Use  . Vaping Use: Never used  Substance and Sexual Activity  . Alcohol use: Yes    Alcohol/week: 3.0 standard drinks    Types: 3 Cans of beer per week    Comment: 1 night a week   . Drug use: No  . Sexual activity: Not on  file  Other Topics Concern  . Not on file  Social History Narrative  . Not on file   Social Determinants of Health   Financial Resource Strain: Not on file  Food Insecurity: Not on file  Transportation Needs: Not on file  Physical Activity: Not on file  Stress: Not on file  Social Connections: Not on file  Intimate Partner Violence: Not on file    Review of Systems: See HPI, otherwise negative ROS  Physical Exam: BP 121/80   Pulse 72   Temp (!) 97.5 F (36.4 C) (Temporal)   Resp 16   Ht 6\' 3"  (1.905 m)   Wt 95.3 kg   SpO2 99%   BMI 26.25 kg/m  General:   Alert,  pleasant and cooperative in NAD Head:  Normocephalic and atraumatic. Neck:  Supple; no masses or thyromegaly. Lungs:  Clear throughout to auscultation.    Heart:  Regular rate and rhythm. Abdomen:  Soft, nontender and nondistended. Normal bowel sounds, without guarding, and without rebound.   Neurologic:  Alert and  oriented x4;  grossly normal neurologically.  Impression/Plan: Kristopher Osborne is here for an colonoscopy to be performed for colon cancer screening  Risks, benefits, limitations, and alternatives regarding  colonoscopy have been reviewed with the  patient.  Questions have been answered.  All parties agreeable.   Sherri Sear, MD  07/19/2020, 9:48 AM

## 2020-07-19 NOTE — Op Note (Signed)
[facility] - Closed Gastroenterology Patient Name: Kristopher Osborne Procedure Date: 07/19/2020 10:03 AM MRN: [id number] Account #: [id number] Date of Birth: 01-Apr-1971 Admit Type: Outpatient Age: 50 Room: [facility] ENDO ROOM 1 Gender: Male Note Status: Finalized Procedure:             Colonoscopy Indications:           Screening for colorectal malignant neoplasm, This is                         the patient's first colonoscopy Providers:             Lin Landsman MD, MD Referring MD:          Lavera Guise, MD (Referring MD) Medicines:             General Anesthesia Complications:         No immediate complications. Estimated blood loss:                         Minimal. Procedure:             Pre-Anesthesia Assessment:                        - Prior to the procedure, a History and Physical was                         performed, and patient medications and allergies were                         reviewed. The patient is competent. The risks and                         benefits of the procedure and the sedation options and                         risks were discussed with the patient. All questions                         were answered and informed consent was obtained.                         Patient identification and proposed procedure were                         verified by the physician, the nurse, the                         anesthesiologist, the anesthetist and the technician                         in the pre-procedure area in the procedure room in the                         endoscopy suite. Mental Status Examination: alert and                         oriented. Airway Examination: normal oropharyngeal  airway and neck mobility. Respiratory Examination:                         clear to auscultation. CV Examination: normal.                         Prophylactic Antibiotics: The patient does not require                         prophylactic  antibiotics. Prior Anticoagulants: The                         patient has taken no previous anticoagulant or                         antiplatelet agents. ASA Grade Assessment: II - A                         patient with mild systemic disease. After reviewing                         the risks and benefits, the patient was deemed in                         satisfactory condition to undergo the procedure. The                         anesthesia plan was to use general anesthesia.                         Immediately prior to administration of medications,                         the patient was re-assessed for adequacy to receive                         sedatives. The heart rate, respiratory rate, oxygen                         saturations, blood pressure, adequacy of pulmonary                         ventilation, and response to care were monitored                         throughout the procedure. The physical status of the                         patient was re-assessed after the procedure.                        After obtaining informed consent, the colonoscope was                         passed under direct vision. Throughout the procedure,                         the patient's blood pressure, pulse, and oxygen  saturations were monitored continuously. The                         Colonoscope was introduced through the anus and                         advanced to the the cecum, identified by appendiceal                         orifice and ileocecal valve. The colonoscopy was                         performed without difficulty. The patient tolerated                         the procedure well. The quality of the bowel                         preparation was evaluated using the BBPS [facility] Bowel                         Preparation Scale) with scores of: Right Colon = 2                         (minor amount of residual staining, small fragments of                          stool and/or opaque liquid, but mucosa seen well),                         Transverse Colon = 3 (entire mucosa seen well with no                         residual staining, small fragments of stool or opaque                         liquid) and Left Colon = 3 (entire mucosa seen well                         with no residual staining, small fragments of stool or                         opaque liquid). The total BBPS score equals 8. Findings:      The perianal and digital rectal examinations were normal. Pertinent       negatives include normal sphincter tone and no palpable rectal lesions.      A 6 mm polyp was found in the rectum. The polyp was sessile. The polyp       was removed with a cold snare. Resection and retrieval were complete.       Estimated blood loss was minimal.      Multiple diverticula were found in the sigmoid colon.      Non-bleeding external hemorrhoids were found during retroflexion. The       hemorrhoids were medium-sized.      A large amount of semi-liquid stool was found in the ascending colon and       in the cecum, precluding visualization. Lavage  of the area was performed       using a large amount of sterile water, resulting in clearance with fair       visualization. Impression:            - One 6 mm polyp in the rectum, removed with a cold                         snare. Resected and retrieved.                        - Diverticulosis in the sigmoid colon.                        - Non-bleeding external hemorrhoids.                        - Stool in the ascending colon and in the cecum. Recommendation:        - Discharge patient to home (with escort).                        - Resume previous diet today.                        - Continue present medications.                        - Await pathology results.                        - Repeat colonoscopy in 5 years for surveillance. Procedure Code(s):     --- Professional ---                        [phone],  Colonoscopy, flexible; with removal of                         tumor(s), polyp(s), or other lesion(s) by snare                         technique Diagnosis Code(s):     --- Professional ---                        Z12.11, Encounter for screening for malignant neoplasm                         of colon                        K62.1, Rectal polyp                        K64.4, Residual hemorrhoidal skin tags                        K57.30, Diverticulosis of large intestine without                         perforation or abscess without bleeding CPT copyright 2019 American Medical Association. All rights reserved. The codes documented in this report are preliminary and upon coder review may  be revised to meet current compliance  requirements. Dr. Ulyess Mort Lin Landsman MD, MD 07/19/2020 10:32:29 AM This report has been signed electronically. Number of Addenda: 0 Note Initiated On: 07/19/2020 10:03 AM Scope Withdrawal Time: 0 hours 10 minutes 19 seconds  Total Procedure Duration: 0 hours 13 minutes 14 seconds  Estimated Blood Loss:  Estimated blood loss: none. Estimated blood loss was                         minimal.      [facility]

## 2020-07-19 NOTE — Anesthesia Preprocedure Evaluation (Signed)
Anesthesia Evaluation  Patient identified by MRN, date of birth, ID band Patient awake    Reviewed: Allergy & Precautions, NPO status , Patient's Chart, lab work & pertinent test results  History of Anesthesia Complications (+) PONV and history of anesthetic complications  Airway Mallampati: II  TM Distance: >3 FB Neck ROM: Full    Dental no notable dental hx. (+) Teeth Intact   Pulmonary neg pulmonary ROS, neg sleep apnea, neg COPD, Patient abstained from smoking.Not current smoker, former smoker,    Pulmonary exam normal breath sounds clear to auscultation       Cardiovascular Exercise Tolerance: Good METS(-) hypertension(-) CAD and (-) Past MI negative cardio ROS  (-) dysrhythmias  Rhythm:Regular Rate:Normal - Systolic murmurs    Neuro/Psych negative neurological ROS  negative psych ROS   GI/Hepatic neg GERD  ,(+)     (-) substance abuse  ,   Endo/Other  neg diabetes  Renal/GU      Musculoskeletal   Abdominal   Peds  Hematology   Anesthesia Other Findings Past Medical History: No date: Hypercholesteremia No date: PONV (postoperative nausea and vomiting)  Reproductive/Obstetrics                             Anesthesia Physical Anesthesia Plan  ASA: II  Anesthesia Plan: General   Post-op Pain Management:    Induction: Intravenous  PONV Risk Score and Plan: 3 and Ondansetron, Propofol infusion and TIVA  Airway Management Planned: Nasal Cannula  Additional Equipment: None  Intra-op Plan:   Post-operative Plan:   Informed Consent: I have reviewed the patients History and Physical, chart, labs and discussed the procedure including the risks, benefits and alternatives for the proposed anesthesia with the patient or authorized representative who has indicated his/her understanding and acceptance.     Dental advisory given  Plan Discussed with: CRNA and  Surgeon  Anesthesia Plan Comments: (Discussed risks of anesthesia with patient, including possibility of difficulty with spontaneous ventilation under anesthesia necessitating airway intervention, PONV, and rare risks such as cardiac or respiratory or neurological events. Patient understands.)        Anesthesia Quick Evaluation

## 2020-07-19 NOTE — Anesthesia Postprocedure Evaluation (Signed)
Anesthesia Post Note  Patient: Kristopher Osborne  Procedure(s) Performed: COLONOSCOPY WITH PROPOFOL (N/A )  Patient location during evaluation: Endoscopy Anesthesia Type: General Level of consciousness: awake and alert Pain management: pain level controlled Vital Signs Assessment: post-procedure vital signs reviewed and stable Respiratory status: spontaneous breathing, nonlabored ventilation, respiratory function stable and patient connected to nasal cannula oxygen Cardiovascular status: blood pressure returned to baseline and stable Postop Assessment: no apparent nausea or vomiting Anesthetic complications: no   No complications documented.   Last Vitals:  Vitals:   07/19/20 1054 07/19/20 1104  BP: 116/83 (!) 124/93  Pulse: (!) 56 (!) 51  Resp: 15 13  Temp:    SpO2: 98% 100%    Last Pain:  Vitals:   07/19/20 1104  TempSrc:   PainSc: 0-No pain                 Arita Miss

## 2020-07-20 ENCOUNTER — Other Ambulatory Visit: Payer: Self-pay | Admitting: Podiatry

## 2020-07-20 ENCOUNTER — Ambulatory Visit: Payer: No Typology Code available for payment source | Admitting: Podiatry

## 2020-07-20 ENCOUNTER — Ambulatory Visit (INDEPENDENT_AMBULATORY_CARE_PROVIDER_SITE_OTHER): Payer: No Typology Code available for payment source

## 2020-07-20 ENCOUNTER — Other Ambulatory Visit: Payer: Self-pay

## 2020-07-20 ENCOUNTER — Ambulatory Visit: Payer: No Typology Code available for payment source

## 2020-07-20 DIAGNOSIS — M722 Plantar fascial fibromatosis: Secondary | ICD-10-CM

## 2020-07-20 LAB — SURGICAL PATHOLOGY

## 2020-07-20 MED ORDER — MELOXICAM 15 MG PO TABS: 15.0000 mg | ORAL_TABLET | Freq: Every day | ORAL | 1 refills | Status: DC

## 2020-07-20 NOTE — Progress Notes (Signed)
   HPI: 50 y.o. male presenting today as a new patient for evaluation of left foot pain is been going on approximately 6 to 7 weeks now.  He denies a history of injury recently.  Patient admits to wearing cheap flip-flops/sandals daily.  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  He states that the pain is intermittent.  He presents for further treatment and evaluation   Past Medical History:  Diagnosis Date  . Hypercholesteremia   . PONV (postoperative nausea and vomiting)      Objective: Physical Exam General: The patient is alert and oriented x3 in no acute distress.  Dermatology: Skin is warm, dry and supple bilateral lower extremities. Negative for open lesions or macerations bilateral.   Vascular: Dorsalis Pedis and Posterior Tibial pulses palpable bilateral.  Capillary fill time is immediate to all digits.  Neurological: Epicritic and protective threshold intact bilateral.   Musculoskeletal: Tenderness to palpation to the plantar aspect of the left heel along the plantar fascia. All other joints range of motion within normal limits bilateral. Strength 5/5 in all groups bilateral.   Radiographic exam: Normal osseous mineralization. Joint spaces preserved. No fracture/dislocation/boney destruction. No other soft tissue abnormalities or radiopaque foreign bodies.   Assessment: 1. Plantar fasciitis left foot  Plan of Care:  1. Patient evaluated. Xrays reviewed.   2.  Patient declined injection today 3.  Recommend that the patient discontinues flip-flops/sandals, or at least wears supportive sandals with an arch support  4. Rx for Meloxicam ordered for patient. 5. Instructed patient regarding therapies and modalities at home to alleviate symptoms.  6. Return to clinic as needed   Edrick Kins, DPM [facility]  Dr. Edrick Kins, DPM    [address], [place] [address]                 Office [phone]  Fax [phone]

## 2020-07-21 ENCOUNTER — Encounter: Payer: Self-pay | Admitting: Gastroenterology

## 2020-07-21 ENCOUNTER — Other Ambulatory Visit: Payer: No Typology Code available for payment source

## 2020-07-22 ENCOUNTER — Encounter: Payer: Self-pay | Admitting: Gastroenterology

## 2020-08-04 ENCOUNTER — Ambulatory Visit (INDEPENDENT_AMBULATORY_CARE_PROVIDER_SITE_OTHER): Payer: No Typology Code available for payment source | Admitting: Hospice and Palliative Medicine

## 2020-08-04 ENCOUNTER — Other Ambulatory Visit: Payer: Self-pay

## 2020-08-04 ENCOUNTER — Encounter: Payer: Self-pay | Admitting: Hospice and Palliative Medicine

## 2020-08-04 VITALS — BP 116/82 | HR 70 | Temp 97.4°F | Resp 16 | Ht 75.0 in | Wt 209.8 lb

## 2020-08-04 DIAGNOSIS — R5383 Other fatigue: Secondary | ICD-10-CM

## 2020-08-04 DIAGNOSIS — E782 Mixed hyperlipidemia: Secondary | ICD-10-CM | POA: Diagnosis not present

## 2020-08-04 DIAGNOSIS — R109 Unspecified abdominal pain: Secondary | ICD-10-CM | POA: Diagnosis not present

## 2020-08-04 DIAGNOSIS — Z125 Encounter for screening for malignant neoplasm of prostate: Secondary | ICD-10-CM | POA: Diagnosis not present

## 2020-08-04 DIAGNOSIS — M722 Plantar fascial fibromatosis: Secondary | ICD-10-CM

## 2020-08-04 NOTE — Progress Notes (Signed)
[facility] [place], [place] [address]  Internal MEDICINE  Office Visit Note  Patient Name: Kristopher Osborne  [id number]  [id number]  Date of Service: 08/05/2020  Chief Complaint  Patient presents with  . Follow-up    x ray and [place] was canceled, pain is not as bad as it was, colonoscopy was done, pt is trying to change diet   . Hyperlipidemia    HPI Patient is here for routine follow-up Seen by podiatrist for left foot pain-diagnosed with plantar fascitis, did not follow through with recommended treatment plan, feels pain is improving, has bought new shoes with insoles for better support  Has not yet had abdominal US for ongoing left sided mid/rib pain--pain has somewhat improved but will occasionally feel a sharp stabbing pain in that area  Had colonoscopy--polyps removed, recommend repeat screening in 5 years  Has not yet started taking statin medication prescribed previously--not wanting to take a medication daily   Current Medication: Outpatient Encounter Medications as of 08/04/2020  Medication Sig  . [DISCONTINUED] meloxicam (MOBIC) 15 MG tablet Take 1 tablet (15 mg total) by mouth daily. (Patient not taking: Reported on 08/04/2020)  . [DISCONTINUED] SUPREP BOWEL PREP KIT 17.5-3.13-1.6 GM/177ML SOLN Take by mouth. (Patient not taking: Reported on 08/04/2020)   No facility-administered encounter medications on file as of 08/04/2020.    Surgical History: Past Surgical History:  Procedure Laterality Date  . COLONOSCOPY WITH PROPOFOL N/A 07/19/2020   Procedure: COLONOSCOPY WITH PROPOFOL;  Surgeon: Lin Landsman, MD;  Location: [facility] ENDOSCOPY;  Service: Gastroenterology;  Laterality: N/A;  . FOOT SURGERY Left 2012    Medical History: Past Medical History:  Diagnosis Date  . Hypercholesteremia   . PONV (postoperative nausea and vomiting)     Family History: Family History  Problem Relation Age of Onset  . Hepatitis C Father   .  Stroke Maternal Grandmother   . Stroke Paternal Grandmother     Social History   Socioeconomic History  . Marital status: Married    Spouse name: Kristopher Osborne   . Number of children: 1  . Years of education: Not on file  . Highest education level: GED or equivalent  Occupational History  . Occupation: Dealer   Tobacco Use  . Smoking status: Former Smoker    Packs/day: 0.50    Years: 7.00    Pack years: 3.50    Types: Cigarettes    Start date: 05/23/1987    Quit date: 08/01/1994    Years since quitting: 26.0  . Smokeless tobacco: Never Used  Vaping Use  . Vaping Use: Never used  Substance and Sexual Activity  . Alcohol use: Yes    Alcohol/week: 3.0 standard drinks    Types: 3 Cans of beer per week    Comment: 1 night a week   . Drug use: No  . Sexual activity: Not on file  Other Topics Concern  . Not on file  Social History Narrative  . Not on file   Social Determinants of Health   Financial Resource Strain: Not on file  Food Insecurity: Not on file  Transportation Needs: Not on file  Physical Activity: Not on file  Stress: Not on file  Social Connections: Not on file  Intimate Partner Violence: Not on file   Review of Systems  Constitutional: Negative for chills, fatigue and unexpected weight change.  HENT: Negative for congestion, postnasal drip, rhinorrhea, sneezing and sore throat.   Eyes: Negative for redness.  Respiratory:  Negative for cough, chest tightness and shortness of breath.   Cardiovascular: Negative for chest pain and palpitations.  Gastrointestinal: Positive for abdominal pain. Negative for constipation, diarrhea, nausea and vomiting.  Genitourinary: Negative for dysuria and frequency.  Musculoskeletal: Negative for arthralgias, back pain, joint swelling and neck pain.  Skin: Negative for rash.  Neurological: Negative for tremors and numbness.  Hematological: Negative for adenopathy. Does not bruise/bleed easily.  Psychiatric/Behavioral: Negative  for behavioral problems (Depression), sleep disturbance and suicidal ideas. The patient is not nervous/anxious.     Vital Signs: BP 116/82   Pulse 70   Temp (!) 97.4 F (36.3 C)   Resp 16   Ht '6\' 3"'  (1.905 m)   Wt 209 lb 12.8 oz (95.2 kg)   SpO2 97%   BMI 26.22 kg/m    Physical Exam Vitals reviewed.  Constitutional:      Appearance: Normal appearance. He is normal weight.  Cardiovascular:     Rate and Rhythm: Normal rate and regular rhythm.     Pulses: Normal pulses.     Heart sounds: Normal heart sounds.  Pulmonary:     Effort: Pulmonary effort is normal.     Breath sounds: Normal breath sounds.  Abdominal:     General: Abdomen is flat.     Palpations: Abdomen is soft.  Musculoskeletal:        General: Normal range of motion.     Cervical back: Normal range of motion.  Skin:    General: Skin is warm.  Neurological:     General: No focal deficit present.     Mental Status: He is alert and oriented to person, place, and time. Mental status is at baseline.  Psychiatric:        Mood and Affect: Mood normal.        Behavior: Behavior normal.        Thought Content: Thought content normal.        Judgment: Judgment normal.    Assessment/Plan: 1. Mixed hyperlipidemia Strongly advised to start statin therapy, he would like to review updated lipid level prior to making decision - Lipid Panel With LDL/HDL Ratio  2. Screening for prostate cancer - PSA, total and free  3. Plantar fasciitis of left foot Pain improving, advised to follow-up with podiatry if pain worsens  4. Left sided abdominal pain Will look into getting scheduled for [place] ordered at previous visit  5. Other fatigue - CBC w/Diff/Platelet - Comprehensive Metabolic Panel (CMET) - TSH + free T4 - Lipid Panel With LDL/HDL Ratio  General Counseling: [name] verbalizes understanding of the findings of todays visit and agrees with plan of treatment. I have discussed any further diagnostic evaluation that  may be needed or ordered today. We also reviewed his medications today. he has been encouraged to call the office with any questions or concerns that should arise related to todays visit.    Orders Placed This Encounter  Procedures  . CBC w/Diff/Platelet  . Comprehensive Metabolic Panel (CMET)  . TSH + free T4  . Lipid Panel With LDL/HDL Ratio  . PSA, total and free    Time spent: 30 Minutes Time spent includes review of chart, medications, test results and follow-up plan with the patient.  This patient was seen by Theodoro Grist AGNP-C in Collaboration with Dr Lavera Guise as a part of collaborative care agreement     Tanna Furry. Qusay Villada AGNP-C Internal medicine

## 2020-08-05 ENCOUNTER — Encounter: Payer: Self-pay | Admitting: Hospice and Palliative Medicine

## 2020-08-05 ENCOUNTER — Other Ambulatory Visit: Payer: Self-pay | Admitting: Hospice and Palliative Medicine

## 2020-08-05 ENCOUNTER — Other Ambulatory Visit: Payer: Self-pay

## 2020-08-05 LAB — COMPREHENSIVE METABOLIC PANEL
ALT: 36 IU/L (ref 0–44)
AST: 29 IU/L (ref 0–40)
Albumin/Globulin Ratio: 1.6 (ref 1.2–2.2)
Albumin: 4.5 g/dL (ref 3.8–4.9)
Alkaline Phosphatase: 102 IU/L (ref 44–121)
BUN/Creatinine Ratio: 14 (ref 9–20)
BUN: 17 mg/dL (ref 6–24)
Bilirubin Total: 0.5 mg/dL (ref 0.0–1.2)
CO2: 21 mmol/L (ref 20–29)
Calcium: 9.5 mg/dL (ref 8.7–10.2)
Chloride: 102 mmol/L (ref 96–106)
Creatinine, Ser: 1.22 mg/dL (ref 0.76–1.27)
Globulin, Total: 2.8 g/dL (ref 1.5–4.5)
Glucose: 93 mg/dL (ref 65–99)
Potassium: 4.6 mmol/L (ref 3.5–5.2)
Sodium: 139 mmol/L (ref 134–144)
Total Protein: 7.3 g/dL (ref 6.0–8.5)
eGFR: 71 mL/min/{1.73_m2} (ref >59)

## 2020-08-05 LAB — CBC WITH DIFFERENTIAL/PLATELET
Basophils Absolute: 0 10*3/uL (ref 0.0–0.2)
Basos: 1 %
EOS (ABSOLUTE): 0.3 10*3/uL (ref 0.0–0.4)
Eos: 5 %
Hematocrit: 42.2 % (ref 37.5–51.0)
Hemoglobin: 14.4 g/dL (ref 13.0–17.7)
Immature Grans (Abs): 0 10*3/uL (ref 0.0–0.1)
Immature Granulocytes: 0 %
Lymphocytes Absolute: 1.7 10*3/uL (ref 0.7–3.1)
Lymphs: 32 %
MCH: 31.6 pg (ref 26.6–33.0)
MCHC: 34.1 g/dL (ref 31.5–35.7)
MCV: 93 fL (ref 79–97)
Monocytes Absolute: 0.4 10*3/uL (ref 0.1–0.9)
Monocytes: 7 %
Neutrophils Absolute: 3.1 10*3/uL (ref 1.4–7.0)
Neutrophils: 55 %
Platelets: 232 10*3/uL (ref 150–450)
RBC: 4.56 x10E6/uL (ref 4.14–5.80)
RDW: 11.6 % (ref 11.6–15.4)
WBC: 5.5 10*3/uL (ref 3.4–10.8)

## 2020-08-05 LAB — LIPID PANEL WITH LDL/HDL RATIO
Cholesterol, Total: 248 mg/dL — ABNORMAL HIGH (ref 100–199)
HDL: 50 mg/dL (ref >39)
LDL Chol Calc (NIH): 178 mg/dL — ABNORMAL HIGH (ref 0–99)
LDL/HDL Ratio: 3.6 ratio (ref 0.0–3.6)
Triglycerides: 110 mg/dL (ref 0–149)
VLDL Cholesterol Cal: 20 mg/dL (ref 5–40)

## 2020-08-05 LAB — PSA, TOTAL AND FREE
PSA, Free Pct: 48.3 %
PSA, Free: 0.29 ng/mL
Prostate Specific Ag, Serum: 0.6 ng/mL (ref 0.0–4.0)

## 2020-08-05 LAB — TSH+FREE T4
Free T4: 1.34 ng/dL (ref 0.82–1.77)
TSH: 1.44 u[IU]/mL (ref 0.450–4.500)

## 2020-08-05 MED ORDER — ROSUVASTATIN CALCIUM 5 MG PO TABS: 5.0000 mg | ORAL_TABLET | Freq: Every day | ORAL | 3 refills | Status: DC

## 2020-08-05 NOTE — Progress Notes (Signed)
Please call and let patient know I have reviewed his labs and that I strongly encourage him to start his statin medication based on elevated lipid levels. Thanks!

## 2020-08-05 NOTE — Telephone Encounter (Signed)
Called pt and informed him of lab results and per Lovena Le to take his crestor medication for cholesterol.  I sent in a new prescription to pt pharmacy

## 2020-08-19 ENCOUNTER — Other Ambulatory Visit: Payer: Self-pay | Admitting: Hospice and Palliative Medicine

## 2020-08-23 ENCOUNTER — Ambulatory Visit
Admission: RE | Admit: 2020-08-23 | Discharge: 2020-08-23 | Disposition: A | Discharge status: Home / Self Care | Payer: No Typology Code available for payment source | Source: Ambulatory Visit | Attending: Hospice and Palliative Medicine | Admitting: Hospice and Palliative Medicine

## 2020-08-23 ENCOUNTER — Other Ambulatory Visit: Payer: Self-pay

## 2020-08-23 DIAGNOSIS — R109 Unspecified abdominal pain: Secondary | ICD-10-CM | POA: Insufficient documentation

## 2020-08-23 NOTE — Progress Notes (Signed)
[place] reviewed, will discuss at upcoming visit.

## 2020-10-07 ENCOUNTER — Encounter: Payer: Self-pay | Admitting: Physician Assistant

## 2020-10-07 ENCOUNTER — Other Ambulatory Visit: Payer: Self-pay

## 2020-10-07 ENCOUNTER — Ambulatory Visit (INDEPENDENT_AMBULATORY_CARE_PROVIDER_SITE_OTHER): Payer: No Typology Code available for payment source | Admitting: Physician Assistant

## 2020-10-07 DIAGNOSIS — E782 Mixed hyperlipidemia: Secondary | ICD-10-CM | POA: Diagnosis not present

## 2020-10-07 DIAGNOSIS — K8020 Calculus of gallbladder without cholecystitis without obstruction: Secondary | ICD-10-CM | POA: Diagnosis not present

## 2020-10-07 DIAGNOSIS — R109 Unspecified abdominal pain: Secondary | ICD-10-CM | POA: Diagnosis not present

## 2020-10-07 DIAGNOSIS — M722 Plantar fascial fibromatosis: Secondary | ICD-10-CM

## 2020-10-07 NOTE — Progress Notes (Signed)
[facility] [place], [place] [address]  Internal MEDICINE  Office Visit Note  Patient Name: Kristopher Osborne  [id number]  [id number]  Date of Service: 10/07/2020  Chief Complaint  Patient presents with  . Follow-up    [place] results  . Hyperlipidemia    HPI Pt is here to review abdominal US results. -Occasional left side pain comes and goes. Hx of a kidney stone 2 years ago unsure if it ever passed. Urology wanted to do procedure but he never did that. He did try flomax for a bit but didn't notice any change and stopped it. No pattern to when the pain occurs. Does not radiate anywhere. It has not bothered him in a few weeks. Pain is along bottom rib on Left side. Denies any chest pain or SOB. 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 Reviewed [place] which just showed cholelithiasis, but pt denies any pain on right side. Not interested in any further evaluation at this point since pain seems to have improved. Martin Majestic to foot doctor who wanted to do cortisone shot but he didn't want to do that, had been told he has plantar fasciitis. -Is taking crestor daily.   Current Medication: Outpatient Encounter Medications as of 10/07/2020  Medication Sig  . rosuvastatin (CRESTOR) 5 MG tablet TAKE 1 TABLET BY MOUTH DAILY.  . benzonatate (TESSALON) 100 MG capsule TAKE 1 CAPSULE (100 MG TOTAL) BY MOUTH EVERY 8 (EIGHT) HOURS FOR 5 DAYS. (Patient not taking: Reported on 10/07/2020)   No facility-administered encounter medications on file as of 10/07/2020.    Surgical History: Past Surgical History:  Procedure Laterality Date  . COLONOSCOPY WITH PROPOFOL N/A 07/19/2020   Procedure: COLONOSCOPY WITH PROPOFOL;  Surgeon: Lin Landsman, MD;  Location: [facility] ENDOSCOPY;  Service: Gastroenterology;  Laterality: N/A;  . FOOT SURGERY Left 2012    Medical History: Past Medical History:  Diagnosis Date  .  Hypercholesteremia   . PONV (postoperative nausea and vomiting)     Family History: Family History  Problem Relation Age of Onset  . Hepatitis C Father   . Stroke Maternal Grandmother   . Stroke Paternal Grandmother     Social History   Socioeconomic History  . Marital status: Married    Spouse name: Helene Kelp   . Number of children: 1  . Years of education: Not on file  . Highest education level: GED or equivalent  Occupational History  . Occupation: Dealer   Tobacco Use  . Smoking status: Former Smoker    Packs/day: 0.50    Years: 7.00    Pack years: 3.50    Types: Cigarettes    Start date: 05/23/1987    Quit date: 08/01/1994    Years since quitting: 26.2  . Smokeless tobacco: Never Used  Vaping Use  . Vaping Use: Never used  Substance and Sexual Activity  . Alcohol use: Yes    Alcohol/week: 3.0 standard drinks    Types: 3 Cans of beer per week    Comment: 1 night a week   . Drug use: No  . Sexual activity: Not on file  Other Topics Concern  . Not on file  Social History Narrative  . Not on file   Social Determinants of Health   Financial Resource Strain: Not on file  Food Insecurity: Not on file  Transportation Needs: Not on file  Physical Activity: Not on file  Stress: Not on file  Social Connections: Not on file  Intimate Partner Violence: Not on file      Review of Systems  Constitutional: Negative for chills, fatigue and unexpected weight change.  HENT: Negative for congestion, postnasal drip, rhinorrhea, sneezing and sore throat.   Eyes: Negative for redness.  Respiratory: Negative for cough, chest tightness and shortness of breath.   Cardiovascular: Negative for chest pain and palpitations.  Gastrointestinal: Negative for abdominal pain, constipation, diarrhea, nausea and vomiting.  Genitourinary: Negative for dysuria and frequency.  Musculoskeletal: Negative for arthralgias, back pain, joint swelling and neck pain.       Foot pain  Skin:  Negative for rash.  Neurological: Negative.  Negative for tremors and numbness.  Hematological: Negative for adenopathy. Does not bruise/bleed easily.  Psychiatric/Behavioral: Negative for behavioral problems (Depression), sleep disturbance and suicidal ideas. The patient is not nervous/anxious.     Vital Signs: BP 132/82   Pulse 66   Temp 98.5 F (36.9 C)   Resp 16   Wt 207 lb (93.9 kg)   SpO2 95%   BMI 25.87 kg/m    Physical Exam Vitals and nursing note reviewed.  Constitutional:      General: He is not in acute distress.    Appearance: He is well-developed. He is not diaphoretic.  HENT:     Head: Normocephalic and atraumatic.     Mouth/Throat:     Pharynx: No oropharyngeal exudate.  Eyes:     Pupils: Pupils are equal, round, and reactive to light.  Neck:     Thyroid: No thyromegaly.     Vascular: No JVD.     Trachea: No tracheal deviation.  Cardiovascular:     Rate and Rhythm: Normal rate and regular rhythm.     Heart sounds: Normal heart sounds. No murmur heard. No friction rub. No gallop.   Pulmonary:     Effort: Pulmonary effort is normal. No respiratory distress.     Breath sounds: No wheezing or rales.  Chest:     Chest wall: No tenderness.  Abdominal:     General: Bowel sounds are normal.     Palpations: Abdomen is soft.     Tenderness: There is no abdominal tenderness. There is no guarding.  Musculoskeletal:        General: Normal range of motion.     Cervical back: Normal range of motion and neck supple.  Lymphadenopathy:     Cervical: No cervical adenopathy.  Skin:    General: Skin is warm and dry.  Neurological:     Mental Status: He is alert and oriented to person, place, and time.     Cranial Nerves: No cranial nerve deficit.  Psychiatric:        Behavior: Behavior normal.        Thought Content: Thought content normal.        Judgment: Judgment normal.        Assessment/Plan: 1. Left sided abdominal pain Seems to be resolving, no  evidence of cause on [place]. Pt declines additional work up and will let us know if it becomes a problem again.  2. Calculus of gallbladder without cholecystitis without obstruction US showed extensive cholelithiasis without cholecystitis. Patient denies any pain on the right side. Will monitor and address if symptoms develop  3. Mixed hyperlipidemia Continue crestor daily  4. Plantar fasciitis of left foot Given meloxicam by podiatry and will f/u with them as needed/desired   General Counseling: Jon Gills understanding of  the findings of todays visit and agrees with plan of treatment. I have discussed any further diagnostic evaluation that may be needed or ordered today. We also reviewed his medications today. he has been encouraged to call the office with any questions or concerns that should arise related to todays visit.    No orders of the defined types were placed in this encounter.   No orders of the defined types were placed in this encounter.   This patient was seen by Drema Dallas, PA-C in collaboration with Dr. Clayborn Bigness as a part of collaborative care agreement.   Total time spent:30 Minutes Time spent includes review of chart, medications, test results, and follow up plan with the patient.      Dr Lavera Guise Internal medicine

## 2020-11-03 ENCOUNTER — Other Ambulatory Visit: Payer: Self-pay

## 2020-11-03 ENCOUNTER — Encounter: Payer: Self-pay | Admitting: Internal Medicine

## 2020-11-03 ENCOUNTER — Other Ambulatory Visit: Payer: Self-pay | Admitting: Internal Medicine

## 2020-11-03 MED ORDER — ROSUVASTATIN CALCIUM 5 MG PO TABS
ORAL_TABLET | Freq: Every day | ORAL | 3 refills | Status: DC
  Filled 2020-11-03: qty 90, 90d supply, fill #0

## 2020-11-04 ENCOUNTER — Other Ambulatory Visit: Payer: Self-pay

## 2020-11-11 ENCOUNTER — Telehealth: Payer: No Typology Code available for payment source | Admitting: Physician Assistant

## 2020-11-11 DIAGNOSIS — U071 COVID-19: Secondary | ICD-10-CM

## 2020-11-12 ENCOUNTER — Other Ambulatory Visit: Payer: Self-pay

## 2020-11-12 MED ORDER — BENZONATATE 100 MG PO CAPS
100.0000 mg | ORAL_CAPSULE | Freq: Three times a day (TID) | ORAL | 0 refills | Status: DC | PRN
Start: 2020-11-12 — End: 2021-02-02
  Filled 2020-11-12: qty 30, 10d supply, fill #0

## 2020-11-12 MED ORDER — FLUTICASONE PROPIONATE 50 MCG/ACT NA SUSP
2.0000 | Freq: Every day | NASAL | 0 refills | Status: DC
Start: 2020-11-12 — End: 2021-02-02
  Filled 2020-11-12: qty 16, 30d supply, fill #0

## 2020-11-12 MED ORDER — ONDANSETRON HCL 4 MG PO TABS
4.0000 mg | ORAL_TABLET | Freq: Three times a day (TID) | ORAL | 0 refills | Status: DC | PRN
  Filled 2020-11-12: qty 20, 7d supply, fill #0

## 2020-11-12 MED ORDER — ALBUTEROL SULFATE HFA 108 (90 BASE) MCG/ACT IN AERS
2.0000 | INHALATION_SPRAY | Freq: Four times a day (QID) | RESPIRATORY_TRACT | 0 refills | Status: DC | PRN
  Filled 2020-11-12: qty 18, 30d supply, fill #0

## 2020-11-12 NOTE — Progress Notes (Signed)
E-Visit  for Positive Covid Test Result  We are sorry you are not feeling well. We are here to help!  You have tested positive for COVID-19, meaning that you were infected with the novel coronavirus and could give the virus to others.  It is vitally important that you stay home so you do not spread it to others.      Please continue isolation at home, for at least 10 days since the start of your symptoms and until you have had 24 hours with no fever (without taking a fever reducer) and with improving of symptoms.  If you have no symptoms but tested positive (or all symptoms resolve after 5 days and you have no fever) you can leave your house but continue to wear a mask around others for an additional 5 days. If you have a fever,continue to stay home until you have had 24 hours of no fever. Most cases improve 5-10 days from onset but we have seen a small number of patients who have gotten worse after the 10 days.  Please be sure to watch for worsening symptoms and remain taking the proper precautions.   Go to the nearest hospital ED for assessment if fever/cough/breathlessness are severe or illness seems like a threat to life.    The following symptoms may appear 2-14 days after exposure: Fever Cough Shortness of breath or difficulty breathing Chills Repeated shaking with chills Muscle pain Headache Sore throat New loss of taste or smell Fatigue Congestion or runny nose Nausea or vomiting Diarrhea  You have been enrolled in [facility] for COVID-19. Daily you will receive a questionnaire within the [facility] website. Our COVID-19 response team will be monitoring your responses daily.  You can use medication such as prescription cough medication called Tessalon Perles 100 mg. You may take 1-2 capsules every 8 hours as needed for cough,  prescription inhaler called Albuterol MDI 90 mcg /actuation 2 puffs every 4 hours as needed for shortness of breath, wheezing, cough,  prescription for Fluticasone nasal spray 2 sprays in each nostril one time per day, and prescription for Zofran 4 mg tablets 1 every 6 hours if needed for nausea  You may also take acetaminophen (Tylenol) as needed for fever.  HOME CARE: Only take medications as instructed by your medical team. Drink plenty of fluids and get plenty of rest. A steam or ultrasonic humidifier can help if you have congestion.   GET HELP RIGHT AWAY IF YOU HAVE EMERGENCY WARNING SIGNS.  Call 911 or proceed to your closest emergency facility if: You develop worsening high fever. Trouble breathing Bluish lips or face Persistent pain or pressure in the chest New confusion Inability to wake or stay awake You cough up blood. Your symptoms become more severe Inability to hold down food or fluids  This list is not all possible symptoms. Contact your medical provider for any symptoms that are severe or concerning to you.    Your e-visit answers were reviewed by a board certified advanced clinical practitioner to complete your personal care plan.  Depending on the condition, your plan could have included both over the counter or prescription medications.  If there is a problem please reply once you have received a response from your provider.  Your safety is important to [place].  If you have drug allergies check your prescription carefully.    You can use MyChart to ask questions about today's visit, request a non-urgent call back, or ask for a  work or school excuse for 24 hours related to this e-Visit. If it has been greater than 24 hours you will need to follow up with your provider, or enter a new e-Visit to address those concerns. You will get an e-mail in the next two days asking about your experience.  I hope that your e-visit has been valuable and will speed your recovery. Thank you for using e-visits.   I provided 6 minutes of non face-to-face time during this encounter for chart review and documentation.

## 2020-11-17 ENCOUNTER — Other Ambulatory Visit: Payer: Self-pay

## 2021-02-02 ENCOUNTER — Other Ambulatory Visit: Payer: Self-pay

## 2021-02-02 ENCOUNTER — Ambulatory Visit (INDEPENDENT_AMBULATORY_CARE_PROVIDER_SITE_OTHER): Payer: No Typology Code available for payment source | Admitting: Nurse Practitioner

## 2021-02-02 ENCOUNTER — Encounter: Payer: Self-pay | Admitting: Nurse Practitioner

## 2021-02-02 ENCOUNTER — Other Ambulatory Visit
Admission: RE | Admit: 2021-02-02 | Discharge: 2021-02-02 | Disposition: A | Discharge status: Home / Self Care | Payer: No Typology Code available for payment source | Source: Ambulatory Visit | Attending: Nurse Practitioner | Admitting: Nurse Practitioner

## 2021-02-02 VITALS — BP 106/84 | HR 65 | Temp 98.3°F | Resp 16 | Ht 75.0 in | Wt 207.2 lb

## 2021-02-02 DIAGNOSIS — Z87891 Personal history of nicotine dependence: Secondary | ICD-10-CM | POA: Insufficient documentation

## 2021-02-02 DIAGNOSIS — E559 Vitamin D deficiency, unspecified: Secondary | ICD-10-CM | POA: Insufficient documentation

## 2021-02-02 DIAGNOSIS — R7301 Impaired fasting glucose: Secondary | ICD-10-CM | POA: Insufficient documentation

## 2021-02-02 DIAGNOSIS — M722 Plantar fascial fibromatosis: Secondary | ICD-10-CM | POA: Insufficient documentation

## 2021-02-02 DIAGNOSIS — E782 Mixed hyperlipidemia: Secondary | ICD-10-CM

## 2021-02-02 DIAGNOSIS — R300 Dysuria: Secondary | ICD-10-CM | POA: Diagnosis not present

## 2021-02-02 DIAGNOSIS — Z28310 Unvaccinated for covid-19: Secondary | ICD-10-CM | POA: Insufficient documentation

## 2021-02-02 DIAGNOSIS — Z0001 Encounter for general adult medical examination with abnormal findings: Secondary | ICD-10-CM | POA: Insufficient documentation

## 2021-02-02 DIAGNOSIS — Z2839 Other underimmunization status: Secondary | ICD-10-CM | POA: Diagnosis not present

## 2021-02-02 DIAGNOSIS — Z9114 Patient's other noncompliance with medication regimen: Secondary | ICD-10-CM | POA: Insufficient documentation

## 2021-02-02 LAB — T4, FREE: Free T4: 0.94 ng/dL (ref 0.61–1.12)

## 2021-02-02 LAB — CBC WITH DIFFERENTIAL/PLATELET
Abs Immature Granulocytes: 0.02 10*3/uL (ref 0.00–0.07)
Basophils Absolute: 0 10*3/uL (ref 0.0–0.1)
Basophils Relative: 1 %
Eosinophils Absolute: 0.3 10*3/uL (ref 0.0–0.5)
Eosinophils Relative: 5 %
HCT: 41.9 % (ref 39.0–52.0)
Hemoglobin: 13.7 g/dL (ref 13.0–17.0)
Immature Granulocytes: 0 %
Lymphocytes Relative: 33 %
Lymphs Abs: 1.9 10*3/uL (ref 0.7–4.0)
MCH: 31.4 pg (ref 26.0–34.0)
MCHC: 32.7 g/dL (ref 30.0–36.0)
MCV: 96.1 fL (ref 80.0–100.0)
Monocytes Absolute: 0.4 10*3/uL (ref 0.1–1.0)
Monocytes Relative: 7 %
Neutro Abs: 3.2 10*3/uL (ref 1.7–7.7)
Neutrophils Relative %: 54 %
Platelets: 230 10*3/uL (ref 150–400)
RBC: 4.36 MIL/uL (ref 4.22–5.81)
RDW: 12.4 % (ref 11.5–15.5)
WBC: 5.9 10*3/uL (ref 4.0–10.5)
nRBC: 0 % (ref 0.0–0.2)

## 2021-02-02 LAB — COMPREHENSIVE METABOLIC PANEL
ALT: 56 U/L — ABNORMAL HIGH (ref 0–44)
AST: 42 U/L — ABNORMAL HIGH (ref 15–41)
Albumin: 4.3 g/dL (ref 3.5–5.0)
Alkaline Phosphatase: 84 U/L (ref 38–126)
Anion gap: 5 (ref 5–15)
BUN: 25 mg/dL — ABNORMAL HIGH (ref 6–20)
CO2: 30 mmol/L (ref 22–32)
Calcium: 9.2 mg/dL (ref 8.9–10.3)
Chloride: 104 mmol/L (ref 98–111)
Creatinine, Ser: 1.18 mg/dL (ref 0.61–1.24)
GFR, Estimated: >60 mL/min (ref >60)
Glucose, Bld: 99 mg/dL (ref 70–99)
Potassium: 4.7 mmol/L (ref 3.5–5.1)
Sodium: 139 mmol/L (ref 135–145)
Total Bilirubin: 0.7 mg/dL (ref 0.3–1.2)
Total Protein: 7.5 g/dL (ref 6.5–8.1)

## 2021-02-02 LAB — LIPID PANEL
Cholesterol: 223 mg/dL — ABNORMAL HIGH (ref 0–200)
HDL: 57 mg/dL (ref >40)
LDL Cholesterol: 145 mg/dL — ABNORMAL HIGH (ref 0–99)
Total CHOL/HDL Ratio: 3.9 RATIO
Triglycerides: 103 mg/dL (ref <150)
VLDL: 21 mg/dL (ref 0–40)

## 2021-02-02 LAB — VITAMIN D 25 HYDROXY (VIT D DEFICIENCY, FRACTURES): Vit D, 25-Hydroxy: 39.64 ng/mL (ref 30–100)

## 2021-02-02 LAB — TSH: TSH: 1.943 u[IU]/mL (ref 0.350–4.500)

## 2021-02-02 MED ORDER — ROSUVASTATIN CALCIUM 5 MG PO TABS
5.0000 mg | ORAL_TABLET | Freq: Every day | ORAL | 1 refills | Status: DC
  Filled 2021-02-02: qty 90, 90d supply, fill #0
  Filled 2021-05-03: qty 90, 90d supply, fill #1

## 2021-02-02 NOTE — Progress Notes (Addendum)
[facility] [place], [place] [address]  Internal MEDICINE  Office Visit Note  Patient Name: Kristopher Osborne  [id number]  [id number]  Date of Service: 02/02/2021  Chief Complaint  Patient presents with   Annual Exam    Left foot still bothers pt sometimes   Hyperlipidemia    HPI Jamiere presents for an annual well visit and physical exam. he has a history of left plantar fasciitis, kidney stones, hyperlipidemia, and impaired fasting glucose. He has declined the COVID vaccine. He lives at home with his wife. He has a 32 yo son and a 49 yo grandson. He had his PSA level checked in march 2022 and it was normal. 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 He is a nonsmoker, and denies recreational drug use. He drinks alcohol every Thursday night, unsure of the amount. He is due for his screening colonoscopy in 2027. He last received tetanus vaccine in 2019. He has declined the shingles and pneumonia vaccines.     Current Medication: Outpatient Encounter Medications as of 02/02/2021  Medication Sig   Cyanocobalamin (B-12 PO) Take by mouth.   [DISCONTINUED] rosuvastatin (CRESTOR) 5 MG tablet TAKE 1 TABLET BY MOUTH DAILY.   rosuvastatin (CRESTOR) 5 MG tablet Take 1 tablet (5 mg total) by mouth daily.   [DISCONTINUED] albuterol (VENTOLIN HFA) 108 (90 Base) MCG/ACT inhaler Inhale 2 puffs into the lungs every 6 (six) hours as needed for wheezing or shortness of breath. (Patient not taking: Reported on 02/02/2021)   [DISCONTINUED] benzonatate (TESSALON) 100 MG capsule Take 1 capsule (100 mg total) by mouth 3 (three) times daily as needed. (Patient not taking: Reported on 02/02/2021)   [DISCONTINUED] fluticasone (FLONASE) 50 MCG/ACT nasal spray Place 2 sprays into both nostrils daily. (Patient not taking: Reported on 02/02/2021)   [DISCONTINUED] ondansetron (ZOFRAN) 4 MG tablet Take 1 tablet (4 mg total) by mouth  every 8 (eight) hours as needed for nausea or vomiting. (Patient not taking: Reported on 02/02/2021)   No facility-administered encounter medications on file as of 02/02/2021.    Surgical History: Past Surgical History:  Procedure Laterality Date   COLONOSCOPY WITH PROPOFOL N/A 07/19/2020   Procedure: COLONOSCOPY WITH PROPOFOL;  Surgeon: Lin Landsman, MD;  Location: [facility] ENDOSCOPY;  Service: Gastroenterology;  Laterality: N/A;   FOOT SURGERY Left 2012    Medical History: Past Medical History:  Diagnosis Date   Hypercholesteremia    PONV (postoperative nausea and vomiting)     Family History: Family History  Problem Relation Age of Onset   Hepatitis C Father    Stroke Maternal Grandmother    Stroke Paternal Grandmother     Social History   Socioeconomic History   Marital status: Married    Spouse name: Helene Kelp    Number of children: 1   Years of education: Not on file   Highest education level: GED or equivalent  Occupational History   Occupation: Dealer   Tobacco Use   Smoking status: Former    Packs/day: 0.50    Years: 7.00    Pack years: 3.50    Types: Cigarettes    Start date: 05/23/1987    Quit date: 08/01/1994    Years since quitting: 26.5   Smokeless tobacco: Never  Vaping Use   Vaping Use: Never used  Substance and Sexual Activity   Alcohol use: Yes    Alcohol/week: 3.0 standard drinks    Types: 3  Cans of beer per week    Comment: 1 night a week    Drug use: No   Sexual activity: Not on file  Other Topics Concern   Not on file  Social History Narrative   Not on file   Social Determinants of Health   Financial Resource Strain: Not on file  Food Insecurity: Not on file  Transportation Needs: Not on file  Physical Activity: Not on file  Stress: Not on file  Social Connections: Not on file  Intimate Partner Violence: Not on file      Review of Systems  Constitutional:  Negative for activity change, appetite change, chills, fatigue,  fever and unexpected weight change.  HENT: Negative.  Negative for congestion, ear pain, rhinorrhea, sore throat and trouble swallowing.   Eyes: Negative.   Respiratory: Negative.  Negative for cough, chest tightness, shortness of breath and wheezing.   Cardiovascular: Negative.  Negative for chest pain.  Gastrointestinal: Negative.  Negative for abdominal pain, blood in stool, constipation, diarrhea, nausea and vomiting.  Endocrine: Negative.   Genitourinary: Negative.  Negative for difficulty urinating, dysuria, frequency, hematuria and urgency.  Musculoskeletal: Negative.  Negative for arthralgias, back pain, joint swelling, myalgias and neck pain.  Skin: Negative.  Negative for rash and wound.  Allergic/Immunologic: Negative.  Negative for immunocompromised state.  Neurological: Negative.  Negative for dizziness, seizures, numbness and headaches.  Hematological: Negative.   Psychiatric/Behavioral: Negative.  Negative for behavioral problems, self-injury and suicidal ideas. The patient is not nervous/anxious.    Vital Signs: BP 106/84   Pulse 65   Temp 98.3 F (36.8 C)   Resp 16   Ht '6\' 3"'  (1.905 m)   Wt 207 lb 3.2 oz (94 kg)   SpO2 97%   BMI 25.90 kg/m    Physical Exam Vitals reviewed.  Constitutional:      General: He is awake. He is not in acute distress.    Appearance: Normal appearance. He is well-developed, well-groomed and overweight. He is not ill-appearing or diaphoretic.  HENT:     Head: Normocephalic and atraumatic.     Right Ear: Tympanic membrane, ear canal and external ear normal.     Left Ear: Tympanic membrane, ear canal and external ear normal.     Nose: Nose normal.     Mouth/Throat:     Lips: Pink.     Mouth: Mucous membranes are moist.     Pharynx: Oropharynx is clear. Uvula midline. No oropharyngeal exudate or posterior oropharyngeal erythema.  Eyes:     General: Lids are normal. Vision grossly intact. Gaze aligned appropriately. No scleral icterus.        Right eye: No discharge.        Left eye: No discharge.     Extraocular Movements: Extraocular movements intact.     Conjunctiva/sclera: Conjunctivae normal.     Pupils: Pupils are equal, round, and reactive to light.     Funduscopic exam:    Right eye: Red reflex present.        Left eye: Red reflex present. Neck:     Thyroid: No thyromegaly.     Vascular: No carotid bruit or JVD.     Trachea: Trachea and phonation normal. No tracheal deviation.  Cardiovascular:     Rate and Rhythm: Normal rate and regular rhythm.     Pulses:          Carotid pulses are 3+ on the right side and 3+ on the left side.  Radial pulses are 2+ on the right side and 2+ on the left side.       Posterior tibial pulses are 2+ on the right side and 2+ on the left side.     Heart sounds: Normal heart sounds, S1 normal and S2 normal. No murmur heard.   No friction rub. No gallop.  Pulmonary:     Effort: Pulmonary effort is normal. No accessory muscle usage or respiratory distress.     Breath sounds: Normal breath sounds and air entry. No stridor. No wheezing or rales.  Chest:     Chest wall: No tenderness.  Abdominal:     General: Bowel sounds are normal. There is no distension.     Palpations: Abdomen is soft. There is no mass.     Tenderness: There is no abdominal tenderness. There is no guarding or rebound.  Musculoskeletal:        General: No tenderness or deformity. Normal range of motion.     Cervical back: Normal range of motion and neck supple.     Right lower leg: No edema.     Left lower leg: No edema.  Lymphadenopathy:     Cervical: No cervical adenopathy.  Skin:    General: Skin is warm and dry.     Capillary Refill: Capillary refill takes less than 2 seconds.     Coloration: Skin is not pale.     Findings: No erythema or rash.  Neurological:     Mental Status: He is alert and oriented to person, place, and time.     Cranial Nerves: No cranial nerve deficit.     Motor: No abnormal  muscle tone.     Coordination: Coordination normal.     Gait: Gait normal.     Deep Tendon Reflexes: Reflexes are normal and symmetric.  Psychiatric:        Mood and Affect: Mood and affect normal.        Behavior: Behavior normal. Behavior is cooperative.        Thought Content: Thought content normal.        Judgment: Judgment normal.       Assessment/Plan: 1. Encounter for general adult medical examination with abnormal findings Age-appropriate preventive screenings and vaccinations discussed, annual physical exam completed. Routine labs for health maintenance ordered, see below. PHM updated. Declined several recommended vaccines. Prostate cancer screening done in march 2022. Colonoscopy due in 2027  2. Plantar fasciitis of left foot Discussed condition and stretches to alleviate pain and help decrease inflammation  3. Mixed hyperlipidemia Last lipid panel showed improvement. Patient has been working on diet modifications to help his cholesterol improve. Repeat lipid panel.  - rosuvastatin (CRESTOR) 5 MG tablet; Take 1 tablet (5 mg total) by mouth daily.  Dispense: 90 tablet; Refill: 1 - Lipid Profile  4. Impaired fasting blood sugar Routine labs ordered - CBC with Differential/Platelet - CMP14+EGFR - TSH + free T4  5. Vitamin D deficiency Rule out low vitamin D - Vitamin D (25 hydroxy)  6. Dysuria Routine urinalysis done.  - UA/M w/rflx Culture, Routine     General Counseling: Tyrece verbalizes understanding of the findings of todays visit and agrees with plan of treatment. I have discussed any further diagnostic evaluation that may be needed or ordered today. We also reviewed his medications today. he has been encouraged to call the office with any questions or concerns that should arise related to todays visit.    Orders Placed This Encounter  Procedures  CBC with Differential/Platelet   CMP14+EGFR   Lipid Profile   TSH + free T4   Vitamin D (25 hydroxy)     Meds ordered this encounter  Medications   rosuvastatin (CRESTOR) 5 MG tablet    Sig: Take 1 tablet (5 mg total) by mouth daily.    Dispense:  90 tablet    Refill:  1    Return in about 1 year (around 02/02/2022) for CPE, [place] PCP.   Total time spent:30 Minutes Time spent includes review of chart, medications, test results, and follow up plan with the patient.   [place] Controlled Substance Database was reviewed by me.  This patient was seen by Jonetta Osgood, FNP-C in collaboration with Dr. Clayborn Bigness as a part of collaborative care agreement.  Peggy Monk R. Valetta Fuller, MSN, FNP-C Internal medicine

## 2021-02-03 LAB — MICROSCOPIC EXAMINATION
Bacteria, UA: NONE SEEN
Casts: NONE SEEN /lpf
Epithelial Cells (non renal): NONE SEEN /hpf (ref 0–10)
RBC, Urine: NONE SEEN /hpf (ref 0–2)
WBC, UA: NONE SEEN /hpf (ref 0–5)

## 2021-02-03 LAB — UA/M W/RFLX CULTURE, ROUTINE
Bilirubin, UA: NEGATIVE
Glucose, UA: NEGATIVE
Ketones, UA: NEGATIVE
Leukocytes,UA: NEGATIVE
Nitrite, UA: NEGATIVE
Protein,UA: NEGATIVE
RBC, UA: NEGATIVE
Specific Gravity, UA: 1.024 (ref 1.005–1.030)
Urobilinogen, Ur: 0.2 mg/dL (ref 0.2–1.0)
pH, UA: 5 (ref 5.0–7.5)

## 2021-03-16 NOTE — Progress Notes (Signed)
I have reviewed the lab results. There are no critically abnormal values requiring immediate intervention. The lipid levels are abnormal. Some interventions that can be done to improve lipid levels include eating leaner proteins, decreasing red meats in diet, adding an OTC fish oil supplement, weight loss, and increasing physical activity. Elevated lipid levels can increase the risk of atherosclerotic cardiovascular disease as well so moderate or high intensity statin therapy may also be recommended.   Liver enzymes are also elevated and have not been elevated in the past, will repeat liver function test in a few months.

## 2021-04-27 ENCOUNTER — Other Ambulatory Visit: Payer: Self-pay | Admitting: Internal Medicine

## 2021-05-03 ENCOUNTER — Other Ambulatory Visit: Payer: Self-pay

## 2021-08-01 ENCOUNTER — Other Ambulatory Visit: Payer: Self-pay | Admitting: Nurse Practitioner

## 2021-08-01 ENCOUNTER — Other Ambulatory Visit: Payer: Self-pay

## 2021-08-01 DIAGNOSIS — E782 Mixed hyperlipidemia: Secondary | ICD-10-CM

## 2021-08-01 MED ORDER — ROSUVASTATIN CALCIUM 5 MG PO TABS
5.0000 mg | ORAL_TABLET | Freq: Every day | ORAL | 1 refills | Status: DC
  Filled 2021-08-01: qty 90, 90d supply, fill #0

## 2021-08-02 IMAGING — CR DG CHEST 2V
2 series · 2 of 2 positions shown · non-contrast
Comparison: 01/10/2011

CLINICAL DATA: Shortness of breath

EXAM:
CHEST - 2 VIEW

[chest pa]
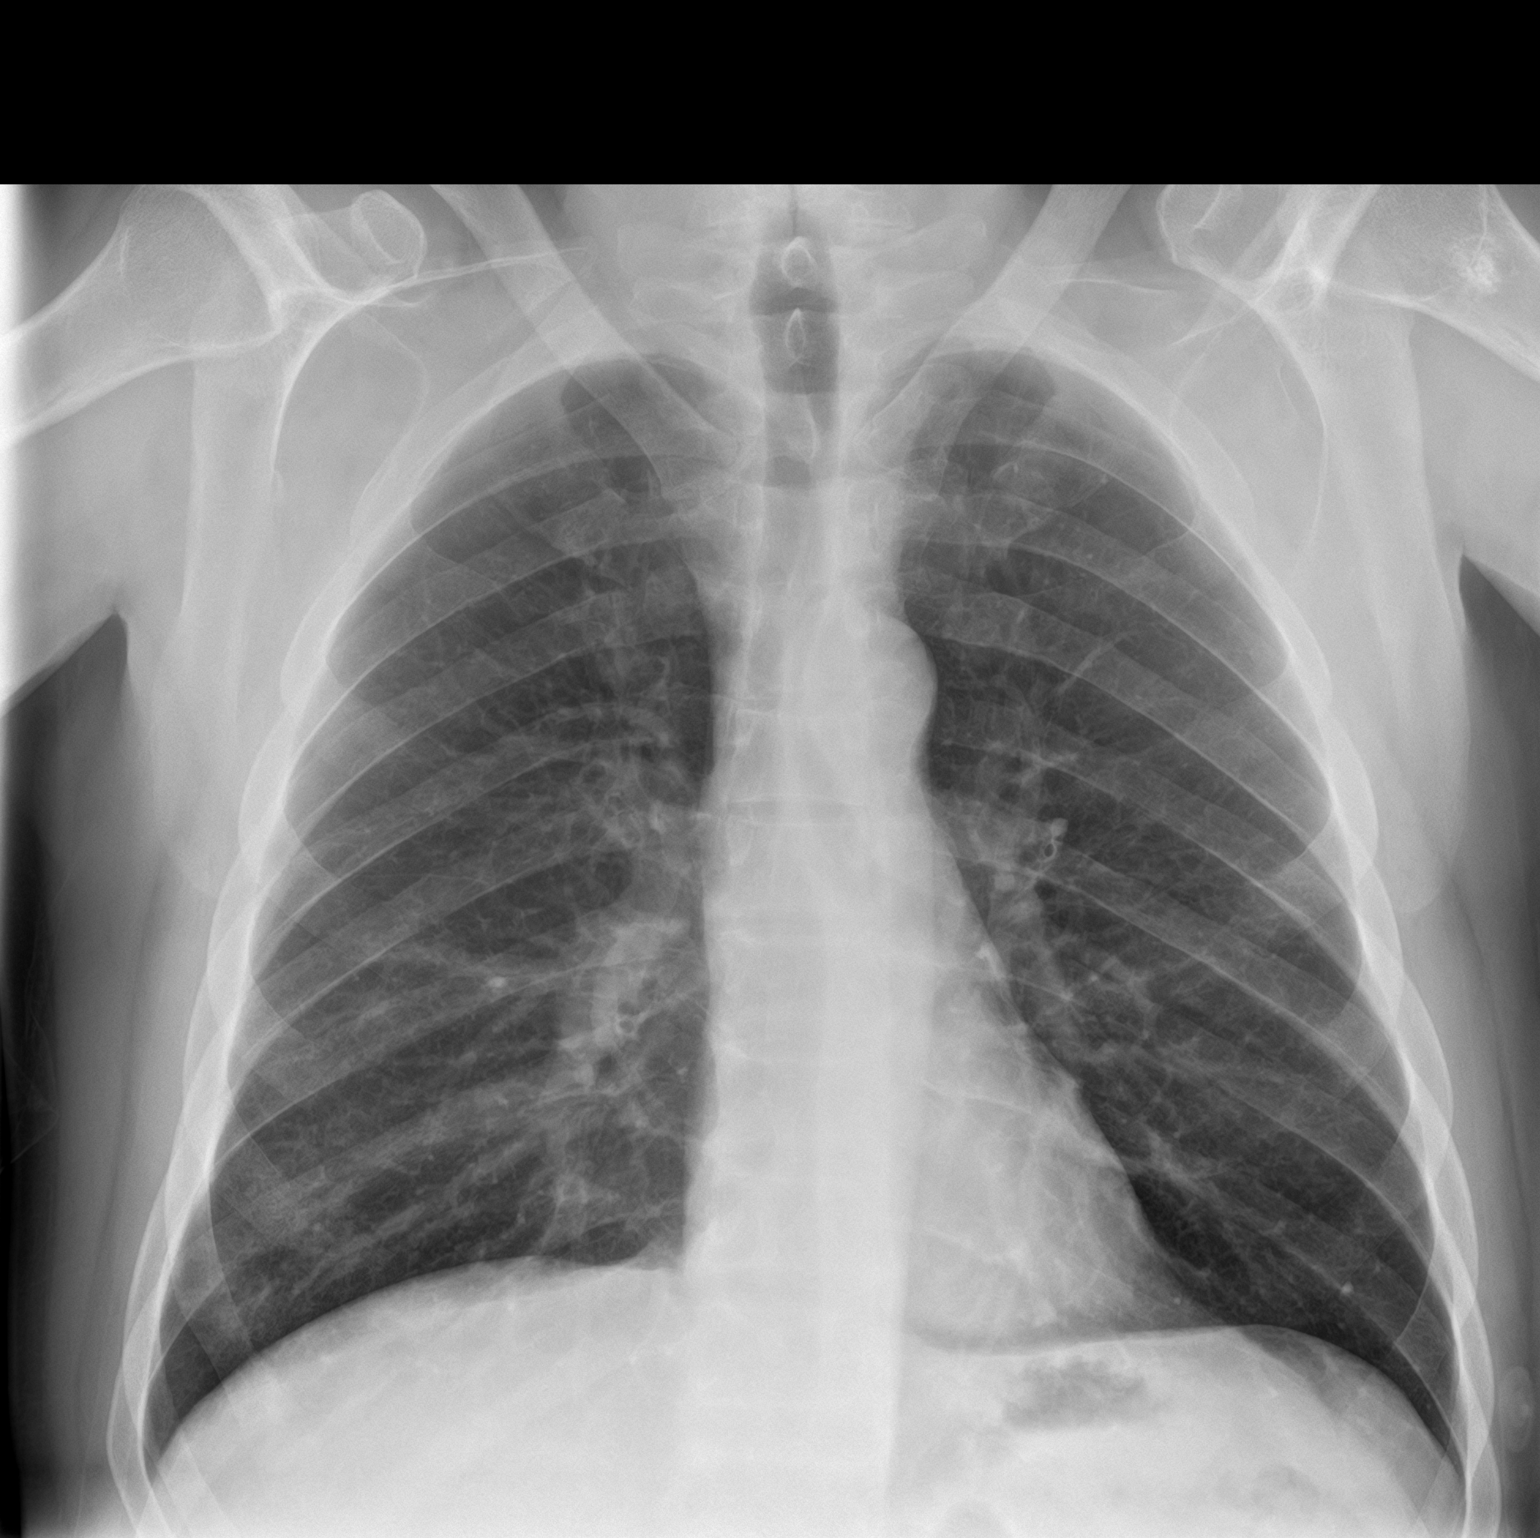

[chest lat]
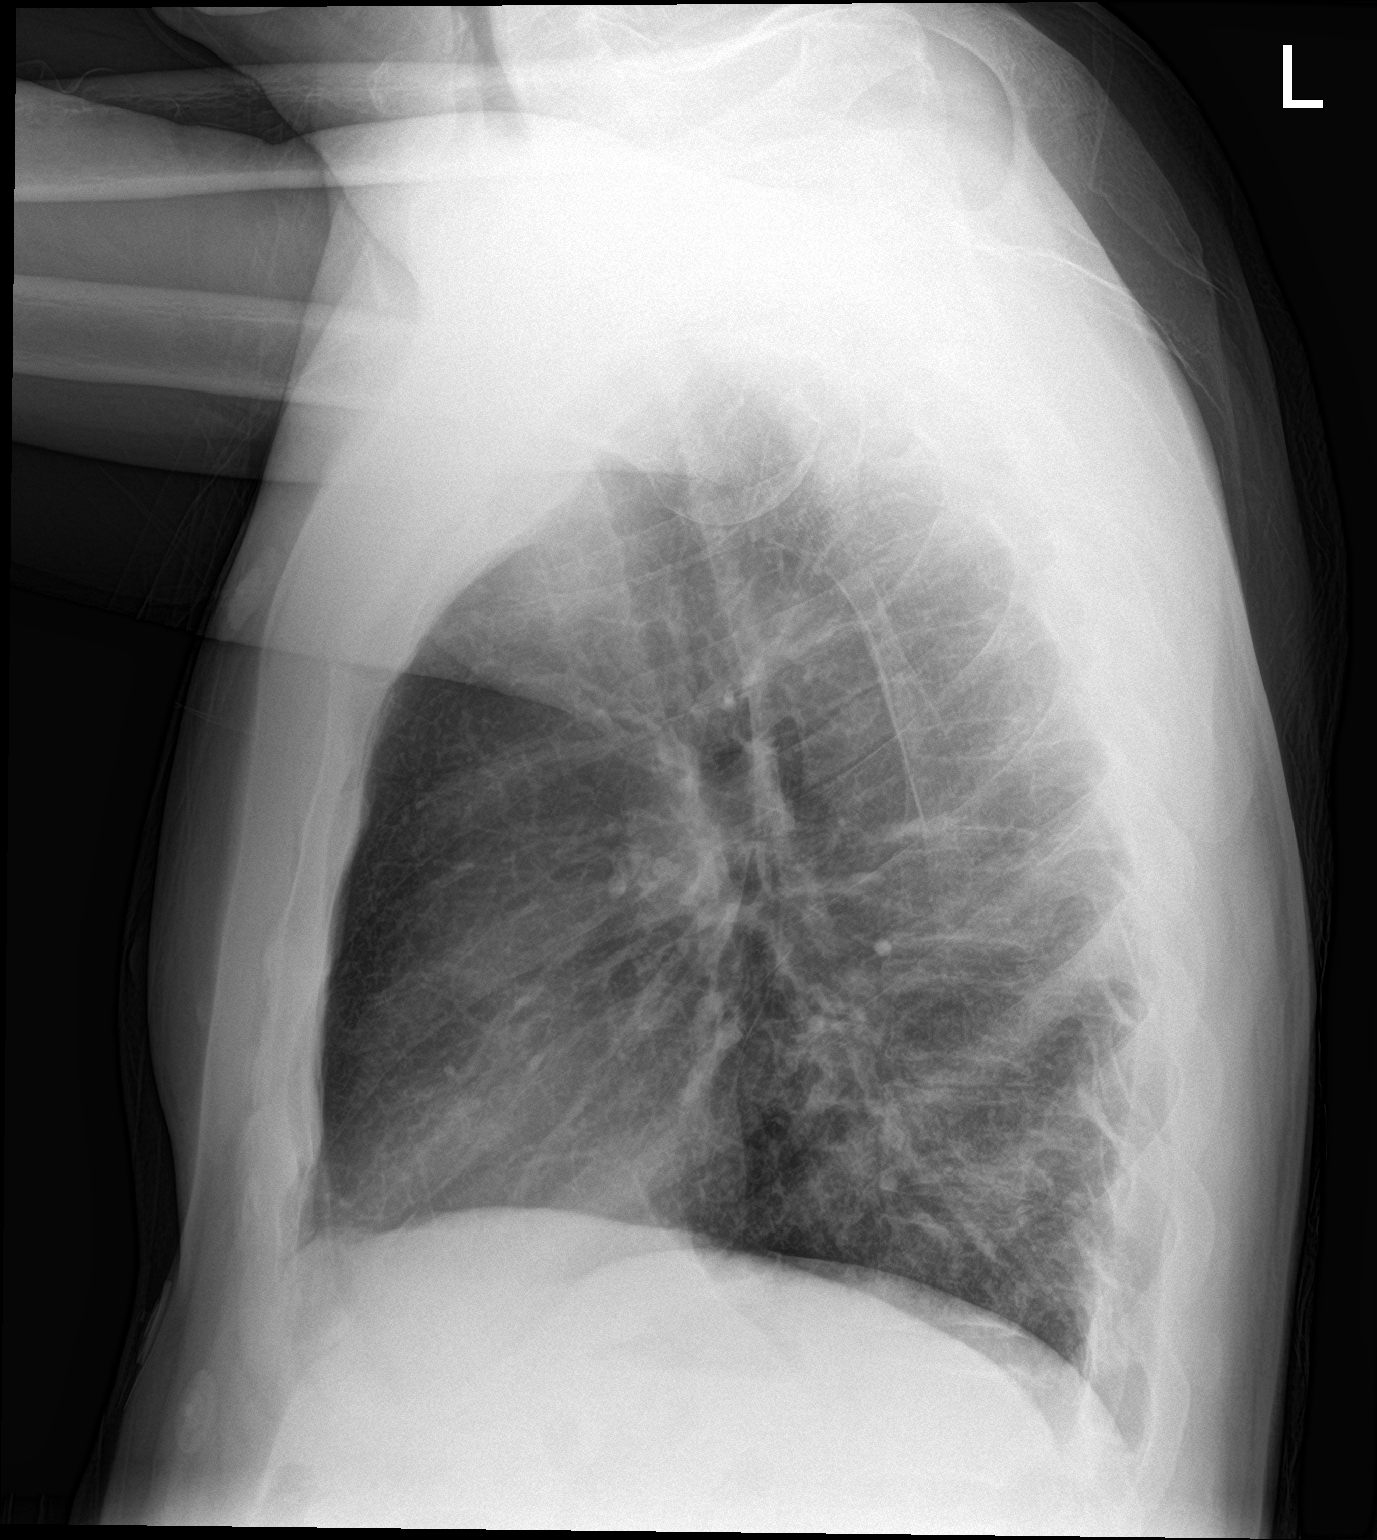

[2 of 2 positions shown; findings below may reference images not displayed]

FINDINGS: The heart size and mediastinal contours are within normal limits. No
focal airspace consolidation, pleural effusion, or pneumothorax.
Small area of sclerosis within the proximal left humeral metaphysis,
most suggestive of an enchondroma. No acute osseous findings.
IMPRESSION: 1. No active cardiopulmonary disease.
2. Probable small enchondroma within the proximal left humeral
metaphysis.

## 2021-11-01 ENCOUNTER — Other Ambulatory Visit: Payer: Self-pay | Admitting: Internal Medicine

## 2021-11-01 NOTE — Telephone Encounter (Signed)
Please see

## 2021-11-02 ENCOUNTER — Other Ambulatory Visit: Payer: Self-pay

## 2021-11-02 DIAGNOSIS — E782 Mixed hyperlipidemia: Secondary | ICD-10-CM

## 2021-11-02 MED ORDER — ROSUVASTATIN CALCIUM 5 MG PO TABS
5.0000 mg | ORAL_TABLET | Freq: Every day | ORAL | 1 refills | Status: DC
  Filled 2021-11-02: qty 90, 90d supply, fill #0
  Filled 2022-01-31: qty 90, 90d supply, fill #1

## 2021-11-02 NOTE — Telephone Encounter (Signed)
Spoke to pt and he only needed his cholesterol medication refill

## 2021-12-05 IMAGING — CR DG FOOT COMPLETE 3+V*L*
1 series · 3 of 3 positions shown · non-contrast
Comparison: January 10, 2011

CLINICAL DATA: Pain posteriorly

EXAM:
LEFT FOOT - COMPLETE 3+ VIEW

[Series 1: dg foot complete left · 0.14mm/px · 3 of 3 slices shown]
[im 1/3]
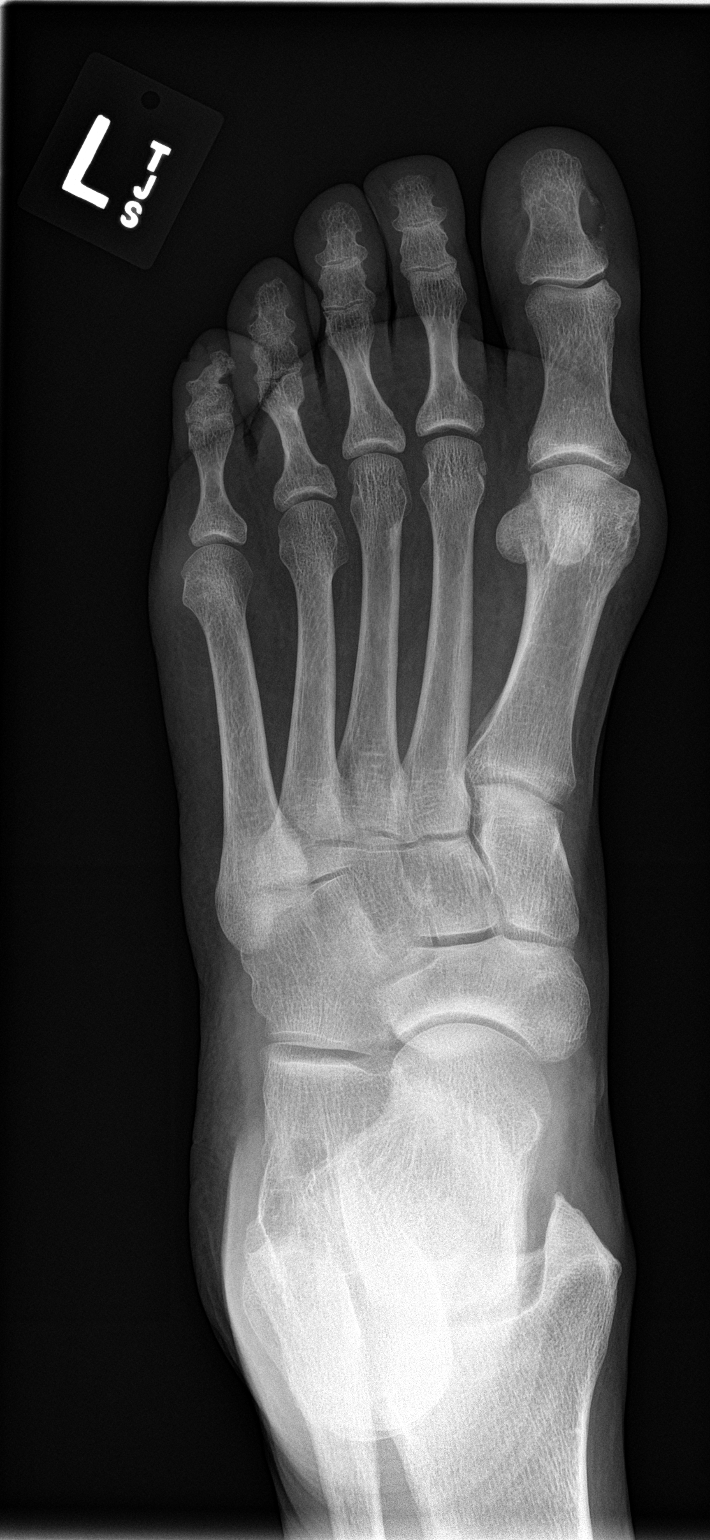
[im 2/3]
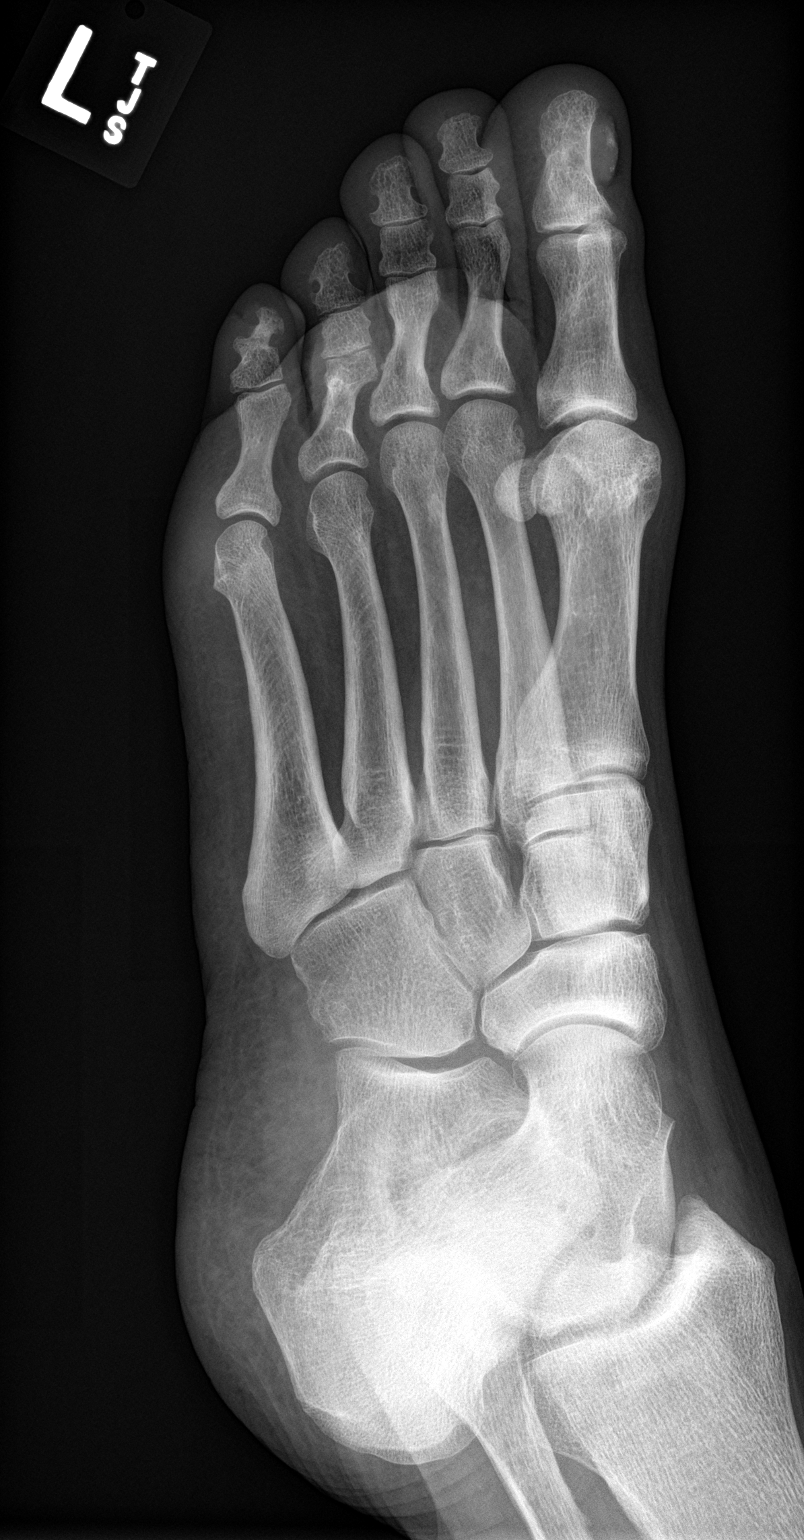
[im 3/3]
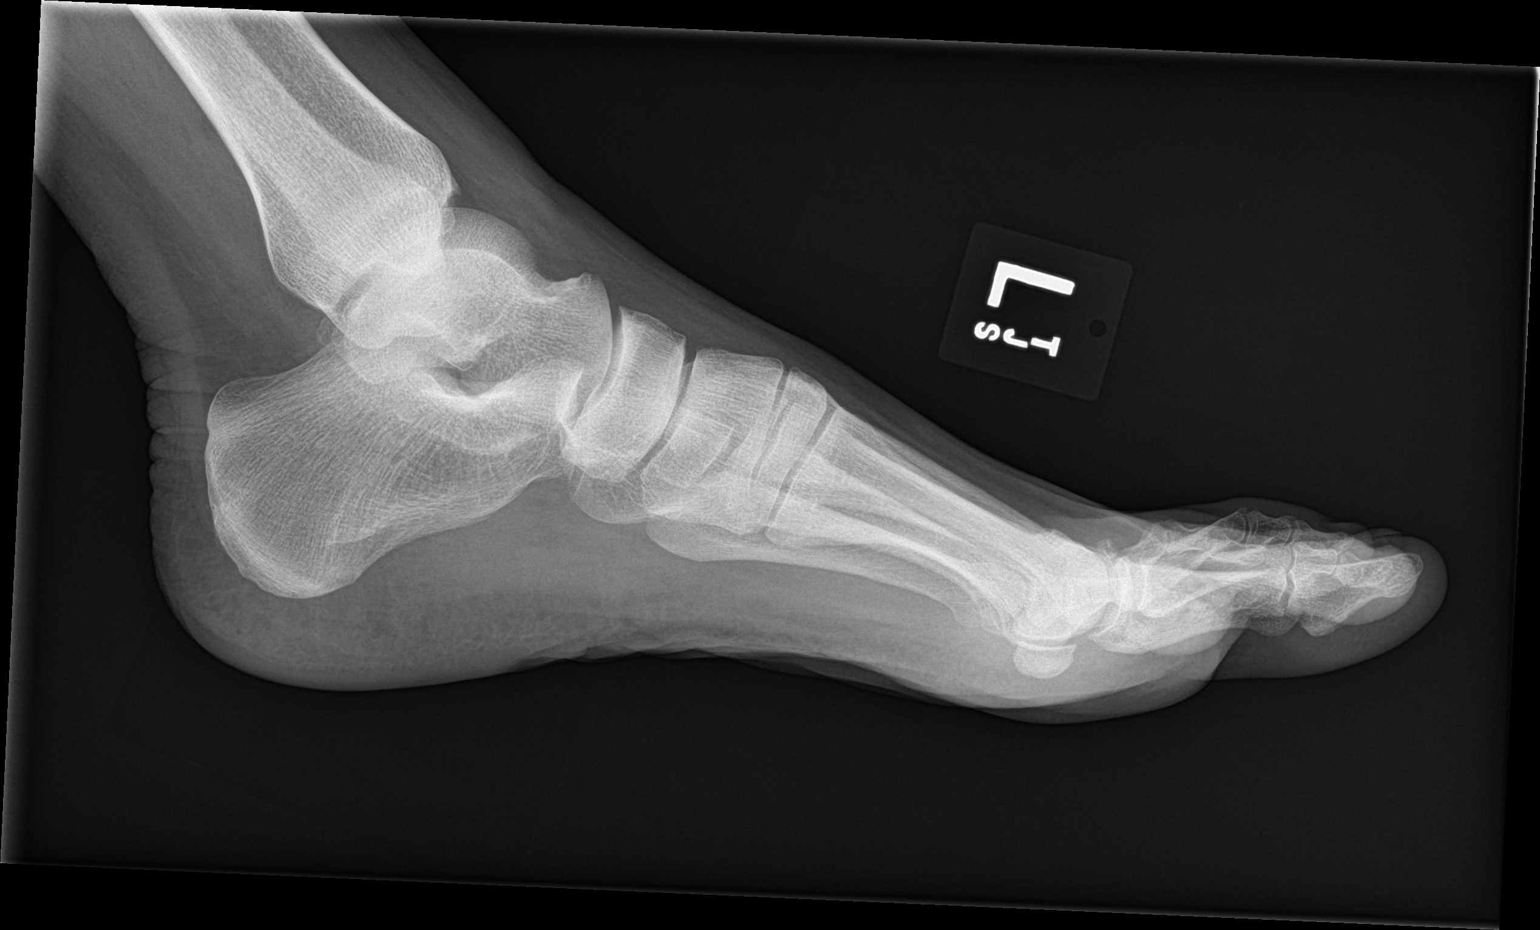

[3 of 3 positions shown; findings below may reference images not displayed]

FINDINGS: Frontal, oblique, and lateral views were obtained. No fracture or
dislocation. There is slight narrowing of the first MTP joint. Other
joint spaces appear unremarkable. No erosive change.
IMPRESSION: Slight narrowing first MTP joint. Other joint spaces appear
unremarkable. No fracture or dislocation.

## 2022-01-18 ENCOUNTER — Encounter: Payer: No Typology Code available for payment source | Admitting: Nurse Practitioner

## 2022-01-23 IMAGING — US US ABDOMEN COMPLETE
1 series · 14 of 25 positions shown · non-contrast
Comparison: None.

CLINICAL DATA: Left abdominal pain

History of nephrolithiasis
EXAM:
ABDOMEN ULTRASOUND COMPLETE

[Series 1: us abdomen complete · 0.20mm/px · 14 of 98 slices shown]
[im 1/98]
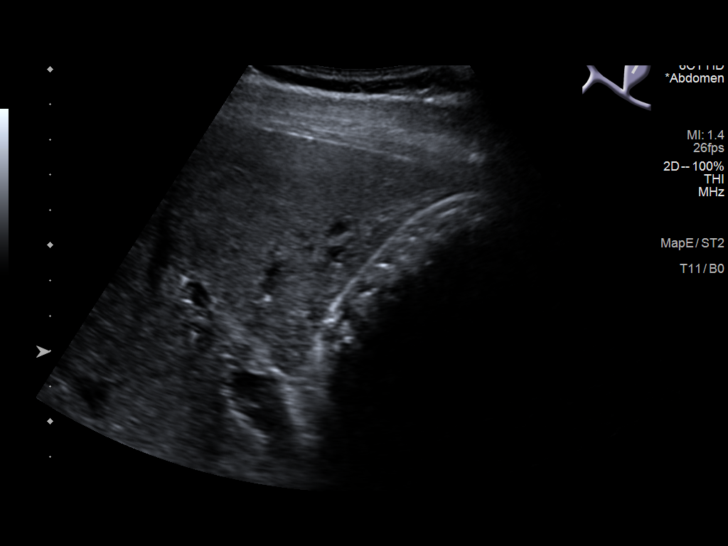
[im 9/98]
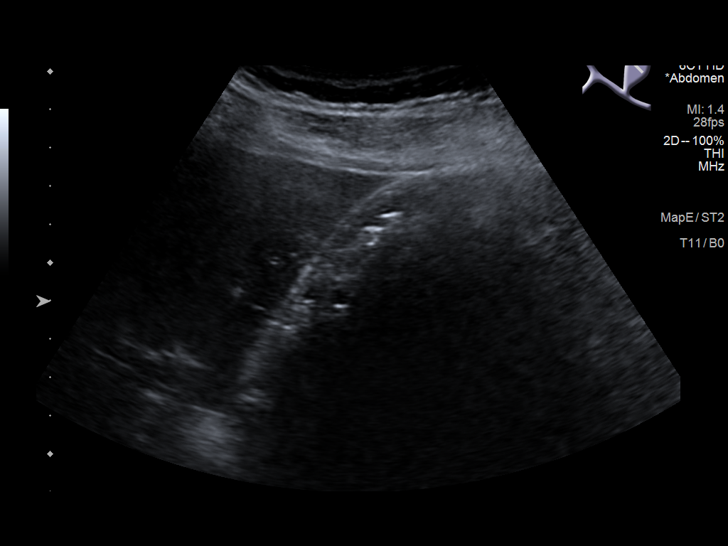
[im 17/98]
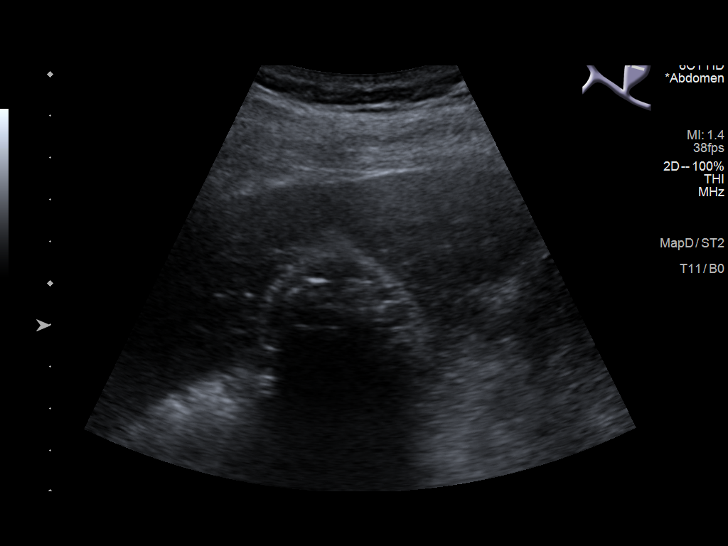
[im 25/98]
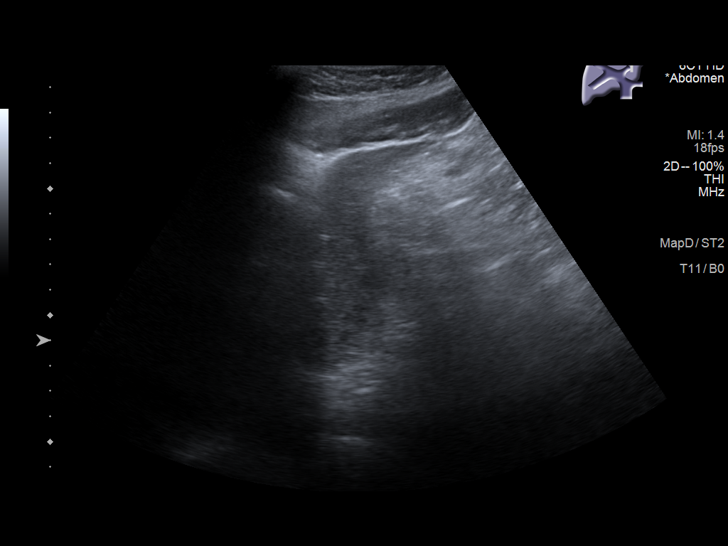
[im 33/98]
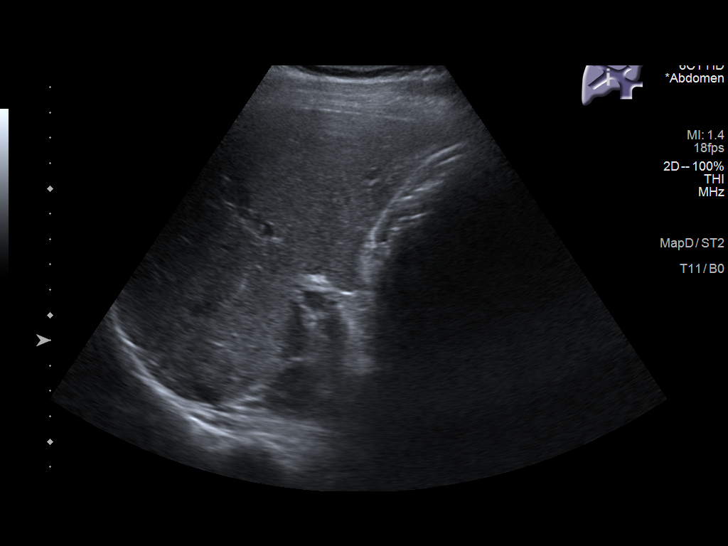
[im 37/98]
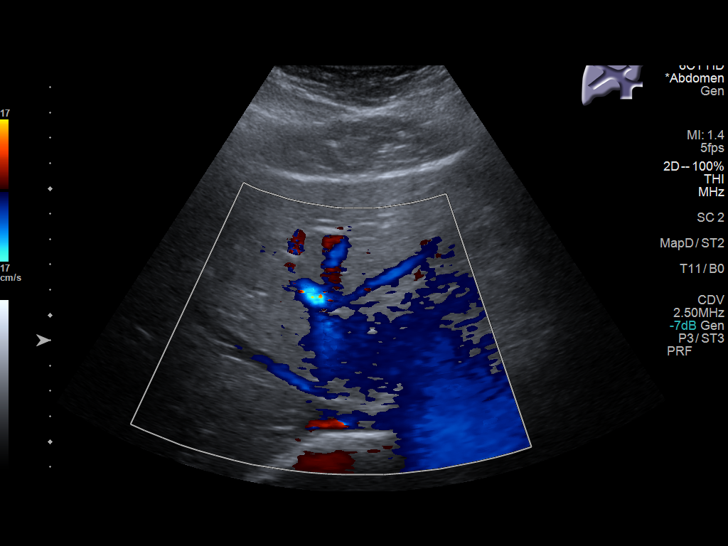
[im 45/98]
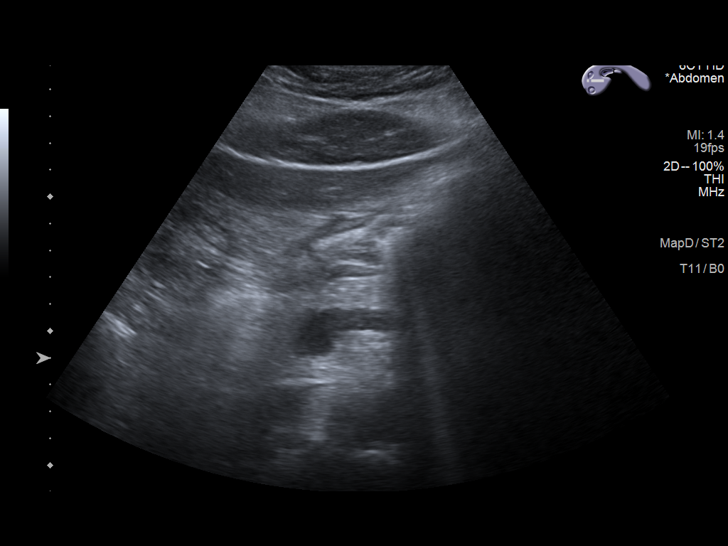
[im 53/98]
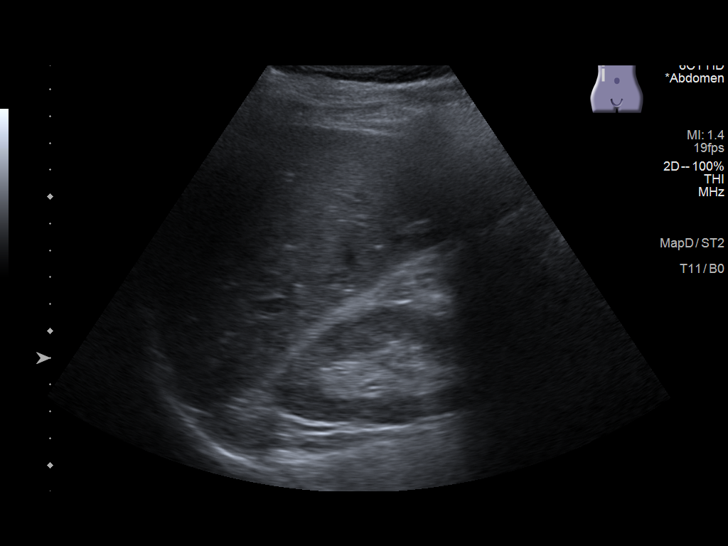
[im 61/98]
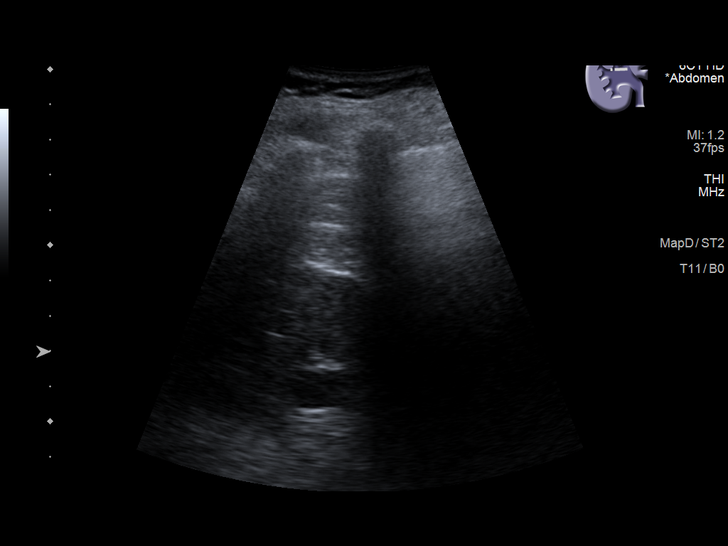
[im 65/98]
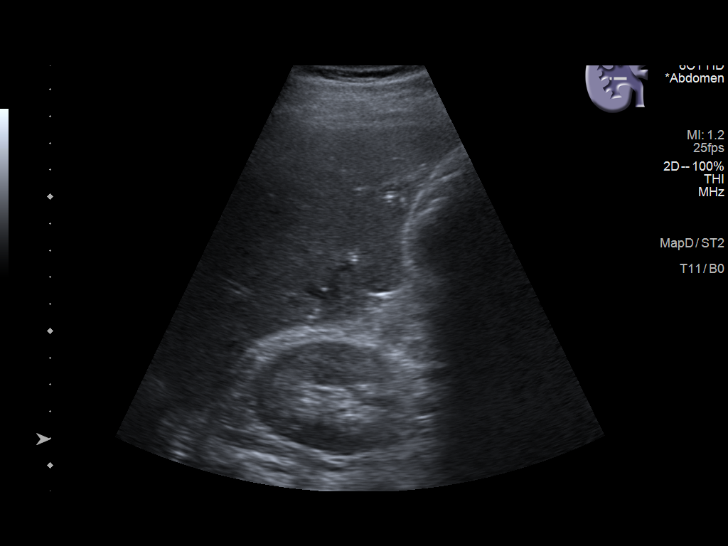
[im 73/98]
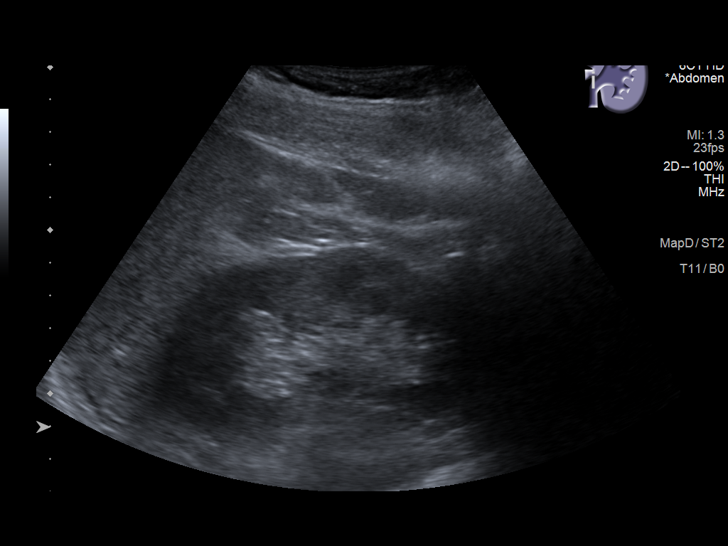
[im 81/98]
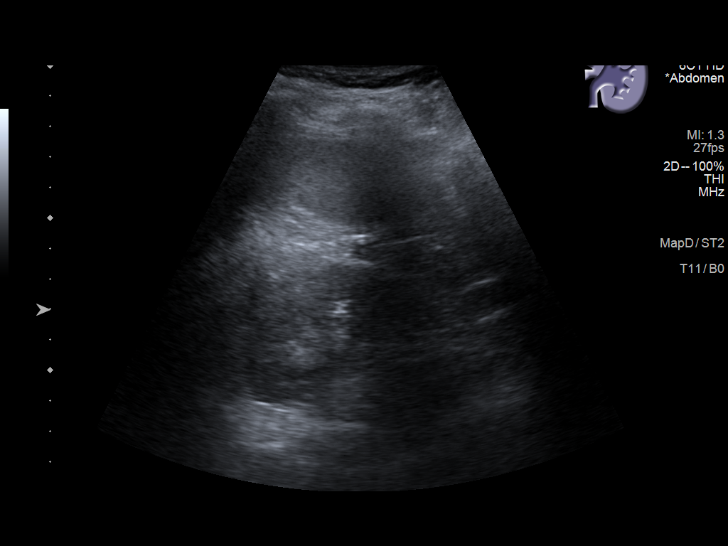
[im 89/98]
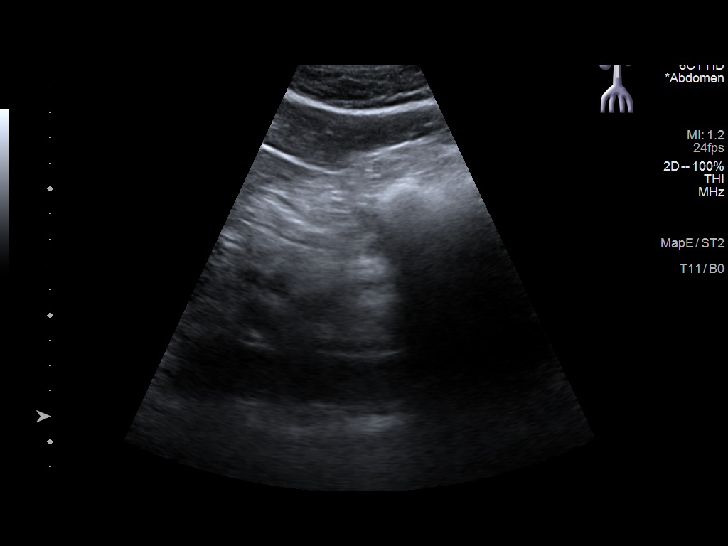
[im 98/98]
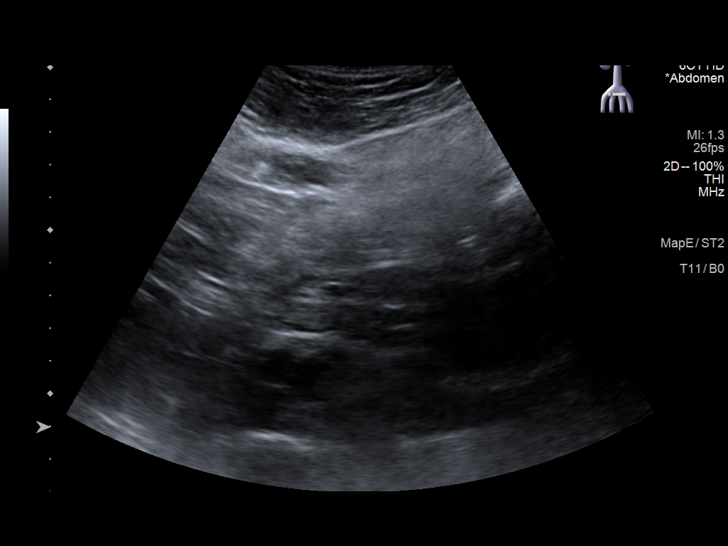

[14 of 25 positions shown; findings below may reference images not displayed]

FINDINGS: Gallbladder: Cholelithiasis. No gallbladder wall thickening or
pericholecystic fluid. Sonographic Murphy sign is negative per
technologist.

Common bile duct: Diameter: 4 mm

Liver: No focal lesion identified. Within normal limits in
parenchymal echogenicity. Portal vein is patent on color Doppler
imaging with normal direction of blood flow towards the liver.

IVC: No abnormality visualized.

Pancreas: Visualized portion unremarkable.

Spleen: Size and appearance within normal limits.

Right Kidney: Length: 9.9 cm. 1.1 cm simple cyst seen in the upper
pole. Echogenicity within normal limits. No mass or hydronephrosis
visualized.

Left Kidney: Length: 10.3 cm. Echogenicity within normal limits. No
mass or hydronephrosis visualized.

Abdominal aorta: No aneurysm visualized.

Other findings: None.
IMPRESSION: Extensive cholelithiasis without additional sonographic evidence of
acute cholecystitis.

## 2022-01-31 ENCOUNTER — Other Ambulatory Visit: Payer: Self-pay

## 2022-02-08 ENCOUNTER — Encounter: Payer: No Typology Code available for payment source | Admitting: Nurse Practitioner

## 2022-02-09 ENCOUNTER — Other Ambulatory Visit: Payer: Self-pay

## 2022-02-09 ENCOUNTER — Encounter: Payer: Self-pay | Admitting: Nurse Practitioner

## 2022-02-09 ENCOUNTER — Ambulatory Visit (INDEPENDENT_AMBULATORY_CARE_PROVIDER_SITE_OTHER): Payer: No Typology Code available for payment source | Admitting: Nurse Practitioner

## 2022-02-09 ENCOUNTER — Other Ambulatory Visit
Admission: RE | Admit: 2022-02-09 | Discharge: 2022-02-09 | Disposition: A | Discharge status: Home / Self Care | Payer: No Typology Code available for payment source | Attending: Nurse Practitioner | Admitting: Nurse Practitioner

## 2022-02-09 VITALS — BP 120/80 | HR 65 | Temp 98.0°F | Resp 16 | Ht 75.0 in | Wt 206.2 lb

## 2022-02-09 DIAGNOSIS — R748 Abnormal levels of other serum enzymes: Secondary | ICD-10-CM | POA: Diagnosis not present

## 2022-02-09 DIAGNOSIS — Z0001 Encounter for general adult medical examination with abnormal findings: Secondary | ICD-10-CM | POA: Diagnosis not present

## 2022-02-09 DIAGNOSIS — M722 Plantar fascial fibromatosis: Secondary | ICD-10-CM

## 2022-02-09 DIAGNOSIS — R300 Dysuria: Secondary | ICD-10-CM

## 2022-02-09 DIAGNOSIS — E782 Mixed hyperlipidemia: Secondary | ICD-10-CM | POA: Diagnosis not present

## 2022-02-09 LAB — CBC WITH DIFFERENTIAL/PLATELET
Abs Immature Granulocytes: 0.01 10*3/uL (ref 0.00–0.07)
Basophils Absolute: 0.1 10*3/uL (ref 0.0–0.1)
Basophils Relative: 1 %
Eosinophils Absolute: 0.7 10*3/uL — ABNORMAL HIGH (ref 0.0–0.5)
Eosinophils Relative: 11 %
HCT: 41.8 % (ref 39.0–52.0)
Hemoglobin: 13.6 g/dL (ref 13.0–17.0)
Immature Granulocytes: 0 %
Lymphocytes Relative: 28 %
Lymphs Abs: 2 10*3/uL (ref 0.7–4.0)
MCH: 31.4 pg (ref 26.0–34.0)
MCHC: 32.5 g/dL (ref 30.0–36.0)
MCV: 96.5 fL (ref 80.0–100.0)
Monocytes Absolute: 0.5 10*3/uL (ref 0.1–1.0)
Monocytes Relative: 7 %
Neutro Abs: 3.8 10*3/uL (ref 1.7–7.7)
Neutrophils Relative %: 53 %
Platelets: 243 10*3/uL (ref 150–400)
RBC: 4.33 MIL/uL (ref 4.22–5.81)
RDW: 12.2 % (ref 11.5–15.5)
WBC: 7 10*3/uL (ref 4.0–10.5)
nRBC: 0 % (ref 0.0–0.2)

## 2022-02-09 LAB — COMPREHENSIVE METABOLIC PANEL
ALT: 34 U/L (ref 0–44)
AST: 41 U/L (ref 15–41)
Albumin: 4.1 g/dL (ref 3.5–5.0)
Alkaline Phosphatase: 73 U/L (ref 38–126)
Anion gap: 6 (ref 5–15)
BUN: 22 mg/dL — ABNORMAL HIGH (ref 6–20)
CO2: 28 mmol/L (ref 22–32)
Calcium: 9 mg/dL (ref 8.9–10.3)
Chloride: 103 mmol/L (ref 98–111)
Creatinine, Ser: 1.16 mg/dL (ref 0.61–1.24)
GFR, Estimated: >60 mL/min (ref >60)
Glucose, Bld: 110 mg/dL — ABNORMAL HIGH (ref 70–99)
Potassium: 4.9 mmol/L (ref 3.5–5.1)
Sodium: 137 mmol/L (ref 135–145)
Total Bilirubin: 0.6 mg/dL (ref 0.3–1.2)
Total Protein: 7.4 g/dL (ref 6.5–8.1)

## 2022-02-09 LAB — LIPID PANEL
Cholesterol: 186 mg/dL (ref 0–200)
HDL: 57 mg/dL (ref >40)
LDL Cholesterol: 115 mg/dL — ABNORMAL HIGH (ref 0–99)
Total CHOL/HDL Ratio: 3.3 RATIO
Triglycerides: 70 mg/dL (ref <150)
VLDL: 14 mg/dL (ref 0–40)

## 2022-02-09 MED ORDER — ROSUVASTATIN CALCIUM 5 MG PO TABS
5.0000 mg | ORAL_TABLET | Freq: Every day | ORAL | 3 refills | Status: DC
  Filled 2022-02-09: qty 90, 90d supply, fill #0

## 2022-02-09 NOTE — Progress Notes (Signed)
[facility] [place], [place] [address]  Internal MEDICINE  Office Visit Note  Patient Name: Kristopher Osborne  [id number]  [id number]  Date of Service: 02/09/2022  Chief Complaint  Patient presents with   Annual Exam    Right heel pain and left side pain   Hyperlipidemia    HPI Kristopher Osborne presents for an annual well visit and physical exam.  well-appearing 51 yo male with high cholesterol and kidney stones --routine colonoscopy done last year, due 2027; PSA normal --takes rosuvastatin for high cholesterol, lipid panel has been steadily improving --elevated liver enzymes last year; drinks approx 3 cans of beer per week --no significant change in diet or lifestyle, tries to exercise a few times a week at home --has right heel pain, has had plantar fasciitis before and was seen by podiatry. --has intermittent left flank pain, thinks it could be a kidney stone      Current Medication: Outpatient Encounter Medications as of 02/09/2022  Medication Sig   [DISCONTINUED] rosuvastatin (CRESTOR) 5 MG tablet Take 1 tablet (5 mg total) by mouth daily.   Cyanocobalamin (B-12 PO) Take by mouth.   rosuvastatin (CRESTOR) 5 MG tablet Take 1 tablet (5 mg total) by mouth daily.   No facility-administered encounter medications on file as of 02/09/2022.    Surgical History: Past Surgical History:  Procedure Laterality Date   COLONOSCOPY WITH PROPOFOL N/A 07/19/2020   Procedure: COLONOSCOPY WITH PROPOFOL;  Surgeon: Lin Landsman, MD;  Location: [facility] ENDOSCOPY;  Service: Gastroenterology;  Laterality: N/A;   FOOT SURGERY Left 2012    Medical History: Past Medical History:  Diagnosis Date   Hypercholesteremia    PONV (postoperative nausea and vomiting)     Family History: Family History  Problem Relation Age of Onset   Hepatitis C Father    Stroke Maternal Grandmother    Stroke Paternal Grandmother     Social History   Socioeconomic History    Marital status: Married    Spouse name: Helene Kelp    Number of children: 1   Years of education: Not on file   Highest education level: GED or equivalent  Occupational History   Occupation: Dealer   Tobacco Use   Smoking status: Former    Packs/day: 0.50    Years: 7.00    Total pack years: 3.50    Types: Cigarettes    Start date: 05/23/1987    Quit date: 08/01/1994    Years since quitting: 27.5   Smokeless tobacco: Never  Vaping Use   Vaping Use: Never used  Substance and Sexual Activity   Alcohol use: Yes    Alcohol/week: 3.0 standard drinks of alcohol    Types: 3 Cans of beer per week    Comment: 1 night a week    Drug use: No   Sexual activity: Not on file  Other Topics Concern   Not on file  Social History Narrative   Not on file   Social Determinants of Health   Financial Resource Strain: Low Risk  (12/19/2017)   Overall Financial Resource Strain (CARDIA)    Difficulty of Paying Living Expenses: Not hard at all  Food Insecurity: No Food Insecurity (12/19/2017)   Hunger Vital Sign    Worried About Running Out of Food in the Last Year: Never true    Ran Out of Food in the Last Year: Never true  Transportation Needs: No Transportation Needs (12/19/2017)   PRAPARE - Transportation    Lack of  Transportation (Medical): No    Lack of Transportation (Non-Medical): No  Physical Activity: Insufficiently Active (12/19/2017)   Exercise Vital Sign    Days of Exercise per Week: 2 days    Minutes of Exercise per Session: 60 min  Stress: No Stress Concern Present (12/19/2017)   [place]    Feeling of Stress : Not at all  Social Connections: Somewhat Isolated (12/19/2017)   Social Connection and Isolation Panel [NHANES]    Frequency of Communication with Friends and Family: Three times a week    Frequency of Social Gatherings with Friends and Family: Twice a week    Attends Religious Services: Never    Museum/gallery conservator or Organizations: No    Attends Archivist Meetings: Never    Marital Status: Married  Human resources officer Violence: Not At Risk (12/19/2017)   Humiliation, Afraid, Rape, and Kick questionnaire    Fear of Current or Ex-Partner: No    Emotionally Abused: No    Physically Abused: No    Sexually Abused: No      Review of Systems  Constitutional:  Negative for activity change, appetite change, chills, fatigue, fever and unexpected weight change.  HENT: Negative.  Negative for congestion, ear pain, rhinorrhea, sore throat and trouble swallowing.   Eyes: Negative.   Respiratory: Negative.  Negative for cough, chest tightness, shortness of breath and wheezing.   Cardiovascular: Negative.  Negative for chest pain.  Gastrointestinal: Negative.  Negative for abdominal pain, blood in stool, constipation, diarrhea, nausea and vomiting.  Endocrine: Negative.   Genitourinary: Negative.  Negative for difficulty urinating, dysuria, frequency, hematuria and urgency.  Musculoskeletal: Negative.  Negative for arthralgias, back pain, joint swelling, myalgias and neck pain.  Skin: Negative.  Negative for rash and wound.  Allergic/Immunologic: Negative.  Negative for immunocompromised state.  Neurological: Negative.  Negative for dizziness, seizures, numbness and headaches.  Hematological: Negative.   Psychiatric/Behavioral: Negative.  Negative for behavioral problems, self-injury and suicidal ideas. The patient is not nervous/anxious.     Vital Signs: BP 120/80 Comment: 126/90  Pulse 65   Temp 98 F (36.7 C)   Resp 16   Ht '6\' 3"'  (1.905 m)   Wt 206 lb 3.2 oz (93.5 kg)   SpO2 99%   BMI 25.77 kg/m    Physical Exam Vitals reviewed.  Constitutional:      General: He is awake. He is not in acute distress.    Appearance: Normal appearance. He is well-developed, well-groomed and overweight. He is not ill-appearing or diaphoretic.  HENT:     Head: Normocephalic and atraumatic.      Right Ear: Tympanic membrane, ear canal and external ear normal.     Left Ear: Tympanic membrane, ear canal and external ear normal.     Nose: Congestion present. No rhinorrhea.     Mouth/Throat:     Lips: Pink.     Mouth: Mucous membranes are moist.     Pharynx: Oropharynx is clear. Uvula midline. No oropharyngeal exudate or posterior oropharyngeal erythema.  Eyes:     General: Lids are normal. Vision grossly intact. Gaze aligned appropriately. No scleral icterus.       Right eye: No discharge.        Left eye: No discharge.     Extraocular Movements: Extraocular movements intact.     Conjunctiva/sclera: Conjunctivae normal.     Pupils: Pupils are equal, round, and reactive to light.  Funduscopic exam:    Right eye: Red reflex present.        Left eye: Red reflex present. Neck:     Thyroid: No thyromegaly.     Vascular: No carotid bruit or JVD.     Trachea: Trachea and phonation normal. No tracheal deviation.  Cardiovascular:     Rate and Rhythm: Normal rate and regular rhythm.     Pulses:          Carotid pulses are 3+ on the right side and 3+ on the left side.      Radial pulses are 2+ on the right side and 2+ on the left side.       Posterior tibial pulses are 2+ on the right side and 2+ on the left side.     Heart sounds: Normal heart sounds, S1 normal and S2 normal. No murmur heard.    No friction rub. No gallop.  Pulmonary:     Effort: Pulmonary effort is normal. No accessory muscle usage or respiratory distress.     Breath sounds: Normal breath sounds and air entry. No stridor. No wheezing or rales.  Chest:     Chest wall: No tenderness.  Abdominal:     General: Bowel sounds are normal. There is no distension.     Palpations: Abdomen is soft. There is no mass.     Tenderness: There is no abdominal tenderness. There is no guarding or rebound.  Musculoskeletal:        General: No tenderness or deformity. Normal range of motion.     Cervical back: Normal range of  motion and neck supple.     Right lower leg: No edema.     Left lower leg: No edema.  Lymphadenopathy:     Cervical: No cervical adenopathy.  Skin:    General: Skin is warm and dry.     Capillary Refill: Capillary refill takes less than 2 seconds.     Coloration: Skin is not pale.     Findings: No erythema or rash.  Neurological:     Mental Status: He is alert and oriented to person, place, and time.     Cranial Nerves: No cranial nerve deficit.     Motor: No abnormal muscle tone.     Coordination: Coordination normal.     Gait: Gait normal.     Deep Tendon Reflexes: Reflexes are normal and symmetric.  Psychiatric:        Mood and Affect: Mood and affect normal.        Behavior: Behavior normal. Behavior is cooperative.        Thought Content: Thought content normal.        Judgment: Judgment normal.        Assessment/Plan: 1. Encounter for general adult medical examination with abnormal findings Age-appropriate preventive screenings and vaccinations discussed, annual physical exam completed. Routine labs for health maintenance ordered, see below. PHM updated.  - CBC with Differential/Platelet - CMP14+EGFR - Lipid Profile  2. Elevated liver enzymes Repeat labs ordered, if liver enzymes are elevated, will ordered RUQ ultrasound - CBC with Differential/Platelet - CMP14+EGFR  3. Plantar fasciitis of left foot Recurrent, encouraged patient to do stretches that he had learned previously, declined podiatry consult, if it bothers him more, he will call the clinic  4. Mixed hyperlipidemia Repeat labs ordered, continue rosuvastatin as prescribed.  - CBC with Differential/Platelet - Lipid Profile - rosuvastatin (CRESTOR) 5 MG tablet; Take 1 tablet (5 mg total) by mouth daily.  Dispense: 90 tablet; Refill: 3  5. Dysuria Routine urinalysis done - UA/M w/rflx Culture, Routine      General Counseling: Magdiel verbalizes understanding of the findings of todays visit and  agrees with plan of treatment. I have discussed any further diagnostic evaluation that may be needed or ordered today. We also reviewed his medications today. he has been encouraged to call the office with any questions or concerns that should arise related to todays visit.    Orders Placed This Encounter  Procedures   UA/M w/rflx Culture, Routine   CBC with Differential/Platelet   CMP14+EGFR   Lipid Profile    Meds ordered this encounter  Medications   rosuvastatin (CRESTOR) 5 MG tablet    Sig: Take 1 tablet (5 mg total) by mouth daily.    Dispense:  90 tablet    Refill:  3    Return in about 1 year (around 02/10/2023) for CPE, Brynnly Bonet PCP.   Total time spent:30 Minutes Time spent includes review of chart, medications, test results, and follow up plan with the patient.   [place] Controlled Substance Database was reviewed by me.  This patient was seen by Jonetta Osgood, FNP-C in collaboration with Dr. Clayborn Bigness as a part of collaborative care agreement.  Vyla Pint R. Valetta Fuller, MSN, FNP-C Internal medicine

## 2022-02-10 LAB — UA/M W/RFLX CULTURE, ROUTINE
Bilirubin, UA: NEGATIVE
Glucose, UA: NEGATIVE
Ketones, UA: NEGATIVE
Leukocytes,UA: NEGATIVE
Nitrite, UA: NEGATIVE
Protein,UA: NEGATIVE
RBC, UA: NEGATIVE
Specific Gravity, UA: 1.02 (ref 1.005–1.030)
Urobilinogen, Ur: 0.2 mg/dL (ref 0.2–1.0)
pH, UA: 5 (ref 5.0–7.5)

## 2022-02-10 LAB — MICROSCOPIC EXAMINATION
Bacteria, UA: NONE SEEN
Casts: NONE SEEN /lpf
Epithelial Cells (non renal): NONE SEEN /hpf (ref 0–10)
RBC, Urine: NONE SEEN /hpf (ref 0–2)
WBC, UA: NONE SEEN /hpf (ref 0–5)

## 2022-02-15 ENCOUNTER — Telehealth: Payer: No Typology Code available for payment source | Admitting: Physician Assistant

## 2022-02-15 ENCOUNTER — Other Ambulatory Visit: Payer: Self-pay

## 2022-02-15 DIAGNOSIS — J020 Streptococcal pharyngitis: Secondary | ICD-10-CM

## 2022-02-15 MED ORDER — AMOXICILLIN 500 MG PO CAPS
500.0000 mg | ORAL_CAPSULE | Freq: Two times a day (BID) | ORAL | 0 refills | Status: AC
  Filled 2022-02-15: qty 20, 10d supply, fill #0

## 2022-02-15 NOTE — Progress Notes (Signed)

## 2022-02-16 ENCOUNTER — Telehealth: Payer: Self-pay

## 2022-02-16 NOTE — Telephone Encounter (Signed)
-----   Message from Lavera Guise, MD sent at 02/12/2022  7:16 PM EDT ----- Mildly elevated glucose and LDL ( bad cholesterol) . Need be modify sweets and sugar intake. If he has questions, make an app to discuss

## 2022-02-16 NOTE — Telephone Encounter (Signed)
Spoke to pt and informed him of his labs and he understood.

## 2022-04-26 ENCOUNTER — Other Ambulatory Visit: Payer: Self-pay

## 2022-04-26 ENCOUNTER — Encounter: Payer: Self-pay | Admitting: Nurse Practitioner

## 2022-04-26 DIAGNOSIS — E782 Mixed hyperlipidemia: Secondary | ICD-10-CM

## 2022-04-26 MED ORDER — ROSUVASTATIN CALCIUM 5 MG PO TABS
5.0000 mg | ORAL_TABLET | Freq: Every day | ORAL | 1 refills | Status: DC
  Filled 2022-04-26: qty 90, 90d supply, fill #0
  Filled 2022-07-26: qty 90, 90d supply, fill #1

## 2022-07-25 ENCOUNTER — Encounter: Payer: Self-pay | Admitting: Nurse Practitioner

## 2022-07-25 DIAGNOSIS — E782 Mixed hyperlipidemia: Secondary | ICD-10-CM

## 2022-07-26 ENCOUNTER — Other Ambulatory Visit: Payer: Self-pay

## 2022-07-28 ENCOUNTER — Other Ambulatory Visit: Payer: Self-pay

## 2022-07-28 MED ORDER — ROSUVASTATIN CALCIUM 5 MG PO TABS
5.0000 mg | ORAL_TABLET | Freq: Every day | ORAL | 3 refills | Status: DC
  Filled 2022-07-28 – 2022-10-25 (×2): qty 90, 90d supply, fill #0
  Filled 2023-01-22: qty 90, 90d supply, fill #1
  Filled 2023-04-24: qty 90, 90d supply, fill #2
  Filled 2023-07-23: qty 90, 90d supply, fill #3

## 2022-10-25 ENCOUNTER — Other Ambulatory Visit: Payer: Self-pay

## 2023-02-15 ENCOUNTER — Encounter: Payer: Self-pay | Admitting: Nurse Practitioner

## 2023-02-15 ENCOUNTER — Ambulatory Visit: Payer: 59 | Admitting: Nurse Practitioner

## 2023-02-15 ENCOUNTER — Other Ambulatory Visit
Admission: RE | Admit: 2023-02-15 | Discharge: 2023-02-15 | Disposition: A | Discharge status: Home / Self Care | Payer: 59 | Source: Ambulatory Visit | Attending: Nurse Practitioner | Admitting: Nurse Practitioner

## 2023-02-15 VITALS — BP 110/72 | HR 68 | Temp 98.3°F | Resp 16 | Ht 75.0 in | Wt 206.0 lb

## 2023-02-15 DIAGNOSIS — M549 Dorsalgia, unspecified: Secondary | ICD-10-CM | POA: Diagnosis not present

## 2023-02-15 DIAGNOSIS — Z0001 Encounter for general adult medical examination with abnormal findings: Secondary | ICD-10-CM | POA: Diagnosis not present

## 2023-02-15 DIAGNOSIS — E782 Mixed hyperlipidemia: Secondary | ICD-10-CM

## 2023-02-15 DIAGNOSIS — R748 Abnormal levels of other serum enzymes: Secondary | ICD-10-CM

## 2023-02-15 DIAGNOSIS — N132 Hydronephrosis with renal and ureteral calculous obstruction: Secondary | ICD-10-CM | POA: Insufficient documentation

## 2023-02-15 LAB — COMPREHENSIVE METABOLIC PANEL
ALT: 27 U/L (ref 0–44)
AST: 23 U/L (ref 15–41)
Albumin: 4 g/dL (ref 3.5–5.0)
Alkaline Phosphatase: 89 U/L (ref 38–126)
Anion gap: 10 (ref 5–15)
BUN: 26 mg/dL — ABNORMAL HIGH (ref 6–20)
CO2: 27 mmol/L (ref 22–32)
Calcium: 9.1 mg/dL (ref 8.9–10.3)
Chloride: 101 mmol/L (ref 98–111)
Creatinine, Ser: 1.08 mg/dL (ref 0.61–1.24)
GFR, Estimated: >60 mL/min (ref >60)
Glucose, Bld: 69 mg/dL — ABNORMAL LOW (ref 70–99)
Potassium: 4.3 mmol/L (ref 3.5–5.1)
Sodium: 138 mmol/L (ref 135–145)
Total Bilirubin: 0.7 mg/dL (ref 0.3–1.2)
Total Protein: 8 g/dL (ref 6.5–8.1)

## 2023-02-15 LAB — CBC WITH DIFFERENTIAL/PLATELET
Abs Immature Granulocytes: 0.02 10*3/uL (ref 0.00–0.07)
Basophils Absolute: 0 10*3/uL (ref 0.0–0.1)
Basophils Relative: 1 %
Eosinophils Absolute: 0.4 10*3/uL (ref 0.0–0.5)
Eosinophils Relative: 6 %
HCT: 41.5 % (ref 39.0–52.0)
Hemoglobin: 13.5 g/dL (ref 13.0–17.0)
Immature Granulocytes: 0 %
Lymphocytes Relative: 29 %
Lymphs Abs: 1.9 10*3/uL (ref 0.7–4.0)
MCH: 31.8 pg (ref 26.0–34.0)
MCHC: 32.5 g/dL (ref 30.0–36.0)
MCV: 97.9 fL (ref 80.0–100.0)
Monocytes Absolute: 0.5 10*3/uL (ref 0.1–1.0)
Monocytes Relative: 7 %
Neutro Abs: 3.6 10*3/uL (ref 1.7–7.7)
Neutrophils Relative %: 57 %
Platelets: 236 10*3/uL (ref 150–400)
RBC: 4.24 MIL/uL (ref 4.22–5.81)
RDW: 11.9 % (ref 11.5–15.5)
WBC: 6.4 10*3/uL (ref 4.0–10.5)
nRBC: 0 % (ref 0.0–0.2)

## 2023-02-15 LAB — LIPID PANEL
Cholesterol: 223 mg/dL — ABNORMAL HIGH (ref 0–200)
HDL: 54 mg/dL (ref >40)
LDL Cholesterol: 139 mg/dL — ABNORMAL HIGH (ref 0–99)
Total CHOL/HDL Ratio: 4.1 RATIO
Triglycerides: 151 mg/dL — ABNORMAL HIGH (ref <150)
VLDL: 30 mg/dL (ref 0–40)

## 2023-02-15 NOTE — Progress Notes (Signed)
[facility] [address] [place], [place] [address]  Internal MEDICINE  Office Visit Note  Patient Name: Kristopher Osborne  [id number]  [id number]  Date of Service: 02/15/2023  Chief Complaint  Patient presents with   Hyperlipidemia   Annual Exam    When bending down feels like something in it between shoulder blades.    HPI Kristopher Osborne presents for an annual well visit and physical exam.  Well-appearing 52 y.o. male with high cholesterol and kidney stones  Routine CRC screening: due in 2027 Taking rosuvastatin for high cholesterol.  Labs: due for routine labs -- hx of elevated liver enzymes which continue to be monitored annually.  New or worsening pain: upper back, --no significant change in diet or lifestyle, tries to exercise a few times a week at home     Current Medication: Outpatient Encounter Medications as of 02/15/2023  Medication Sig   Cyanocobalamin (B-12 PO) Take by mouth.   rosuvastatin (CRESTOR) 5 MG tablet Take 1 tablet (5 mg total) by mouth daily.   No facility-administered encounter medications on file as of 02/15/2023.    Surgical History: Past Surgical History:  Procedure Laterality Date   COLONOSCOPY WITH PROPOFOL N/A 07/19/2020   Procedure: COLONOSCOPY WITH PROPOFOL;  Surgeon: Toney Reil, MD;  Location: [facility] ENDOSCOPY;  Service: Gastroenterology;  Laterality: N/A;   FOOT SURGERY Left 2012    Medical History: Past Medical History:  Diagnosis Date   Hypercholesteremia    PONV (postoperative nausea and vomiting)     Family History: Family History  Problem Relation Age of Onset   Hepatitis C Father    Stroke Maternal Grandmother    Stroke Paternal Grandmother     Social History   Socioeconomic History   Marital status: Married    Spouse name: Rosey Bath    Number of children: 1   Years of education: Not on file   Highest education level: GED or equivalent  Occupational History   Occupation: Curator   Tobacco Use    Smoking status: Former    Current packs/day: 0.00    Average packs/day: 0.5 packs/day for 7.2 years (3.6 ttl pk-yrs)    Types: Cigarettes    Start date: 05/23/1987    Quit date: 08/01/1994    Years since quitting: 28.5   Smokeless tobacco: Never  Vaping Use   Vaping status: Never Used  Substance and Sexual Activity   Alcohol use: Yes    Alcohol/week: 3.0 standard drinks of alcohol    Types: 3 Cans of beer per week    Comment: 1 night a week    Drug use: No   Sexual activity: Not on file  Other Topics Concern   Not on file  Social History Narrative   Not on file   Social Determinants of Health   Financial Resource Strain: Low Risk  (12/19/2017)   Overall Financial Resource Strain (CARDIA)    Difficulty of Paying Living Expenses: Not hard at all  Food Insecurity: No Food Insecurity (12/19/2017)   Hunger Vital Sign    Worried About Running Out of Food in the Last Year: Never true    Ran Out of Food in the Last Year: Never true  Transportation Needs: No Transportation Needs (12/19/2017)   PRAPARE - Administrator, Civil Service (Medical): No    Lack of Transportation (Non-Medical): No  Physical Activity: Insufficiently Active (12/19/2017)   Exercise Vital Sign    Days of Exercise per Week: 2 days  Minutes of Exercise per Session: 60 min  Stress: No Stress Concern Present (12/19/2017)   [employer] of Occupational Health - Occupational Stress Questionnaire    Feeling of Stress : Not at all  Social Connections: Somewhat Isolated (12/19/2017)   Social Connection and Isolation Panel [NHANES]    Frequency of Communication with Friends and Family: Three times a week    Frequency of Social Gatherings with Friends and Family: Twice a week    Attends Religious Services: Never    Database administrator or Organizations: No    Attends Banker Meetings: Never    Marital Status: Married  Catering manager Violence: Not At Risk (12/19/2017)   Humiliation, Afraid,  Rape, and Kick questionnaire    Fear of Current or Ex-Partner: No    Emotionally Abused: No    Physically Abused: No    Sexually Abused: No      Review of Systems  Constitutional:  Negative for activity change, appetite change, chills, fatigue, fever and unexpected weight change.  HENT: Negative.  Negative for congestion, ear pain, rhinorrhea, sore throat and trouble swallowing.   Eyes: Negative.   Respiratory: Negative.  Negative for cough, chest tightness, shortness of breath and wheezing.   Cardiovascular: Negative.  Negative for chest pain.  Gastrointestinal: Negative.  Negative for abdominal pain, blood in stool, constipation, diarrhea, nausea and vomiting.  Endocrine: Negative.   Genitourinary: Negative.  Negative for difficulty urinating, dysuria, frequency, hematuria and urgency.  Musculoskeletal:  Positive for arthralgias and back pain (mid back pain). Negative for joint swelling, myalgias and neck pain.  Skin: Negative.  Negative for rash and wound.  Allergic/Immunologic: Negative.  Negative for immunocompromised state.  Neurological: Negative.  Negative for dizziness, seizures, numbness and headaches.  Hematological: Negative.   Psychiatric/Behavioral: Negative.  Negative for behavioral problems, self-injury and suicidal ideas. The patient is not nervous/anxious.     Vital Signs: BP 110/72   Pulse 68   Temp 98.3 F (36.8 C)   Resp 16   Ht 6\' 3"  (1.905 m)   Wt 206 lb (93.4 kg)   SpO2 98%   BMI 25.75 kg/m    Physical Exam Vitals reviewed.  Constitutional:      General: He is awake. He is not in acute distress.    Appearance: Normal appearance. He is well-developed, well-groomed and overweight. He is not ill-appearing or diaphoretic.  HENT:     Head: Normocephalic and atraumatic.     Right Ear: Tympanic membrane, ear canal and external ear normal.     Left Ear: Tympanic membrane, ear canal and external ear normal.     Nose: Nose normal. No congestion or  rhinorrhea.     Mouth/Throat:     Lips: Pink.     Mouth: Mucous membranes are moist.     Pharynx: Oropharynx is clear. Uvula midline. No oropharyngeal exudate or posterior oropharyngeal erythema.  Eyes:     General: Lids are normal. Vision grossly intact. Gaze aligned appropriately. No scleral icterus.       Right eye: No discharge.        Left eye: No discharge.     Extraocular Movements: Extraocular movements intact.     Conjunctiva/sclera: Conjunctivae normal.     Pupils: Pupils are equal, round, and reactive to light.     Funduscopic exam:    Right eye: Red reflex present.        Left eye: Red reflex present. Neck:     Thyroid: No thyromegaly.  Vascular: No carotid bruit or JVD.     Trachea: Trachea and phonation normal. No tracheal deviation.  Cardiovascular:     Rate and Rhythm: Normal rate and regular rhythm.     Pulses:          Carotid pulses are 3+ on the right side and 3+ on the left side.      Radial pulses are 2+ on the right side and 2+ on the left side.       Posterior tibial pulses are 2+ on the right side and 2+ on the left side.     Heart sounds: Normal heart sounds, S1 normal and S2 normal. No murmur heard.    No friction rub. No gallop.  Pulmonary:     Effort: Pulmonary effort is normal. No accessory muscle usage or respiratory distress.     Breath sounds: Normal breath sounds and air entry. No stridor. No wheezing or rales.  Chest:     Chest wall: No tenderness.  Abdominal:     General: Bowel sounds are normal. There is no distension.     Palpations: Abdomen is soft. There is no mass.     Tenderness: There is no abdominal tenderness. There is no guarding or rebound.  Musculoskeletal:        General: No tenderness or deformity. Normal range of motion.     Cervical back: Normal range of motion and neck supple.     Right lower leg: No edema.     Left lower leg: No edema.  Lymphadenopathy:     Cervical: No cervical adenopathy.  Skin:    General: Skin is  warm and dry.     Capillary Refill: Capillary refill takes less than 2 seconds.     Coloration: Skin is not pale.     Findings: No erythema or rash.  Neurological:     Mental Status: He is alert and oriented to person, place, and time.     Cranial Nerves: No cranial nerve deficit.     Motor: No abnormal muscle tone.     Coordination: Coordination normal.     Gait: Gait normal.     Deep Tendon Reflexes: Reflexes are normal and symmetric.  Psychiatric:        Mood and Affect: Mood and affect normal.        Behavior: Behavior normal. Behavior is cooperative.        Thought Content: Thought content normal.        Judgment: Judgment normal.       02/15/2023   12:00 PM  Depression screen PHQ 2/9  Decreased Interest 0  Down, Depressed, Hopeless 0  PHQ - 2 Score 0       Assessment/Plan: 1. Encounter for routine adult health examination with abnormal findings Age-appropriate preventive screenings and vaccinations discussed, annual physical exam completed. Routine labs for health maintenance ordered, see below. PHM updated.  - Lipid Profile - CBC with Differential/Platelet - Comprehensive metabolic panel  2. Acute mid back pain Xray ordered to rule out skeletal abnormalities  - DG Thoracic Spine W/Swimmers; Future  3. Elevated liver enzymes Routine labs ordered  - Lipid Profile - CBC with Differential/Platelet - Comprehensive metabolic panel  4. Hydronephrosis with urinary obstruction due to renal calculus Routine labs ordered  - Lipid Profile - CBC with Differential/Platelet - Comprehensive metabolic panel  5. Mixed hyperlipidemia Routine labs ordered - Lipid Profile - CBC with Differential/Platelet - Comprehensive metabolic panel      General Counseling:  Darryal verbalizes understanding of the findings of todays visit and agrees with plan of treatment. I have discussed any further diagnostic evaluation that may be needed or ordered today. We also reviewed his  medications today. he has been encouraged to call the office with any questions or concerns that should arise related to todays visit.    Orders Placed This Encounter  Procedures   DG Thoracic Spine W/Swimmers   Lipid Profile   CBC with Differential/Platelet   Comprehensive metabolic panel    No orders of the defined types were placed in this encounter.   Return in about 1 year (around 02/15/2024) for CPE, Kumar Falwell PCP and otherwise as needed. .   Total time spent:30 Minutes Time spent includes review of chart, medications, test results, and follow up plan with the patient.   [place] Controlled Substance Database was reviewed by me.  This patient was seen by Sallyanne Kuster, FNP-C in collaboration with Dr. Beverely Risen as a part of collaborative care agreement.  Shona Pardo R. Tedd Sias, MSN, FNP-C Internal medicine

## 2023-02-16 ENCOUNTER — Encounter: Payer: Self-pay | Admitting: Nurse Practitioner

## 2023-03-31 ENCOUNTER — Other Ambulatory Visit: Payer: Self-pay | Admitting: Nurse Practitioner

## 2023-03-31 DIAGNOSIS — R7301 Impaired fasting glucose: Secondary | ICD-10-CM

## 2023-03-31 NOTE — Progress Notes (Signed)
--   lipid panel is worse -- high triglycerides 151, high LDL 139 -- verify patient taking his rosuvastatin as prescribed, if he is, I want to increase the dose to 10 mg daily. If he is not, please emphasize the importance of taking the rosuvastatin every day  Low fasting glucose at 69 with prior elevated fasting glucose -- check A1c, lab ordered  The rest of his labs are normal

## 2023-07-23 ENCOUNTER — Other Ambulatory Visit: Payer: Self-pay

## 2023-10-23 ENCOUNTER — Other Ambulatory Visit: Payer: Self-pay | Admitting: Nurse Practitioner

## 2023-10-23 ENCOUNTER — Other Ambulatory Visit: Payer: Self-pay

## 2023-10-23 DIAGNOSIS — E782 Mixed hyperlipidemia: Secondary | ICD-10-CM

## 2023-10-23 MED FILL — Rosuvastatin Calcium Tab 5 MG: ORAL | 90 days supply | Qty: 90 | Fill #0 | Status: AC

## 2024-01-12 ENCOUNTER — Encounter: Payer: Self-pay | Admitting: Emergency Medicine

## 2024-01-12 ENCOUNTER — Emergency Department
Admission: EM | Admit: 2024-01-12 | Discharge: 2024-01-12 | Disposition: A | Discharge status: Home / Self Care | Attending: Emergency Medicine | Admitting: Emergency Medicine

## 2024-01-12 ENCOUNTER — Other Ambulatory Visit: Payer: Self-pay

## 2024-01-12 DIAGNOSIS — S71122A Laceration with foreign body, left thigh, initial encounter: Secondary | ICD-10-CM | POA: Insufficient documentation

## 2024-01-12 DIAGNOSIS — S81812A Laceration without foreign body, left lower leg, initial encounter: Secondary | ICD-10-CM

## 2024-01-12 DIAGNOSIS — W270XXA Contact with workbench tool, initial encounter: Secondary | ICD-10-CM | POA: Insufficient documentation

## 2024-01-12 DIAGNOSIS — S8992XA Unspecified injury of left lower leg, initial encounter: Secondary | ICD-10-CM | POA: Diagnosis present

## 2024-01-12 DIAGNOSIS — Y92009 Unspecified place in unspecified non-institutional (private) residence as the place of occurrence of the external cause: Secondary | ICD-10-CM | POA: Insufficient documentation

## 2024-01-12 MED ORDER — LIDOCAINE-EPINEPHRINE (PF) 2 %-1:200000 IJ SOLN
10.0000 mL | Freq: Once | INTRAMUSCULAR | Status: AC
  Administered 2024-01-12: 10 mL
  Filled 2024-01-12: qty 20

## 2024-01-12 MED ORDER — CEPHALEXIN 500 MG PO CAPS
500.0000 mg | ORAL_CAPSULE | Freq: Two times a day (BID) | ORAL | 0 refills | Status: AC
  Filled 2024-01-12: qty 14, 7d supply, fill #0

## 2024-01-12 MED ORDER — TETANUS-DIPHTH-ACELL PERTUSSIS 5-2.5-18.5 LF-MCG/0.5 IM SUSY
0.5000 mL | PREFILLED_SYRINGE | Freq: Once | INTRAMUSCULAR | Status: DC
  Filled 2024-01-12: qty 0.5

## 2024-01-12 NOTE — ED Notes (Signed)
 Lidocaine  placed bedside for EDP

## 2024-01-12 NOTE — ED Provider Notes (Signed)
 [facility] Emergency Department Provider Note     Event Date/Time   First MD Initiated Contact with Patient 01/12/24 1350     (approximate)   History   Laceration   HPI  Kristopher Osborne is a 53 y.o. male presents to the ED for evaluation of a laceration to his left anterior thigh.  Patient reports he was using a new blade to saw some wood at home and reports he came down on the saw. The saw went through his pant leg. Last tetanus 2019.  Denies numbness or decreased movement.     Physical Exam   Triage Vital Signs: ED Triage Vitals  Encounter Vitals Group     BP 01/12/24 1314 (!) 113/92     Girls Systolic BP Percentile --      Girls Diastolic BP Percentile --      Boys Systolic BP Percentile --      Boys Diastolic BP Percentile --      Pulse Rate 01/12/24 1314 65     Resp 01/12/24 1314 18     Temp 01/12/24 1314 98.1 F (36.7 C)     Temp Source 01/12/24 1314 Oral     SpO2 01/12/24 1314 98 %     Weight 01/12/24 1312 200 lb (90.7 kg)     Height 01/12/24 1312 6' 3 (1.905 m)     Head Circumference --      Peak Flow --      Pain Score 01/12/24 1312 4     Pain Loc --      Pain Education --      Exclude from Growth Chart --     Most recent vital signs: Vitals:   01/12/24 1314 01/12/24 1627  BP: (!) 113/92 114/84  Pulse: 65 65  Resp: 18 18  Temp: 98.1 F (36.7 C)   SpO2: 98% 97%    General Awake, no distress.  HEENT NCAT.  CV:  Good peripheral perfusion.  RESP:  Normal effort.  ABD:  No distention.  Other:  9 cm vertical laceration to anterior left thigh. No muscle involvement. FROM of knee joint.  Neurovascular status intact all throughout.   ED Results / Procedures / Treatments   Labs (all labs ordered are listed, but only abnormal results are displayed) Labs Reviewed - No data to display  No results found.  PROCEDURES:  Critical Care performed: No  .Laceration Repair  Date/Time: 01/12/2024 4:09 PM  Performed  by: Margrette Monte A, PA-C Authorized by: Margrette Monte A, PA-C   Consent:    Consent obtained:  Verbal   Consent given by:  Patient   Risks, benefits, and alternatives were discussed: yes     Risks discussed:  Infection, pain, retained foreign body, poor cosmetic result, poor wound healing, need for additional repair and nerve damage Laceration details:    Location:  Leg   Leg location:  L upper leg   Length (cm):  9   Depth (mm):  3 Pre-procedure details:    Preparation:  Patient was prepped and draped in usual sterile fashion Exploration:    Hemostasis achieved with:  Epinephrine  and direct pressure   Imaging outcome: foreign body noted (debris from pants)     Wound exploration: wound explored through full range of motion and entire depth of wound visualized     Wound extent: foreign bodies/material     Wound extent: areolar tissue not violated, fascia not violated, no signs of injury, no  nerve damage, no tendon damage, no underlying fracture and no vascular damage     Foreign bodies/material:  Jeans material   Contaminated: no   Treatment:    Area cleansed with:  Povidone-iodine and saline   Amount of cleaning:  Extensive   Irrigation solution:  Sterile saline   Irrigation method:  Pressure wash   Visualized foreign bodies/material removed: yes   Skin repair:    Repair method:  Sutures   Suture size:  3-0   Suture material:  Nylon   Suture technique:  Simple interrupted and horizontal mattress   Number of sutures:  9 Approximation:    Approximation:  Close Repair type:    Repair type:  Simple Post-procedure details:    Dressing:  Non-adherent dressing   Procedure completion:  Tolerated   MEDICATIONS ORDERED IN ED: Medications  Tdap (BOOSTRIX) injection 0.5 mL (0.5 mLs Intramuscular Patient Refused/Not Given 01/12/24 1441)  lidocaine -EPINEPHrine  (XYLOCAINE  W/EPI) 2 %-1:200000 (PF) injection 10 mL (10 mLs Infiltration Given by Other 01/12/24 1600)   IMPRESSION / MDM /  ASSESSMENT AND PLAN / ED COURSE  I reviewed the triage vital signs and the nursing notes.                               53 y.o. male presents to the emergency department for evaluation and treatment of laceration. See HPI for further details.   Differential diagnosis includes, but is not limited to laceration, abrasion, musculoskeletal injury  Patient's presentation is most consistent with acute complicated illness / injury requiring diagnostic workup.  Patient is alert and oriented.  He is hemodynamically stable.  Physical exam findings are stated above.  Patient declined tetanus booster. Please see procedure note for laceration repair.  Patient tolerated well.  9 sutures placed.  Advised to return to ED in 7 to 10 days for suture removal.  Education on wound care provided.  Short course of antibiotics sent to pharmacy.  Patient stable condition for discharge home.  ED return precaution discussed.   FINAL CLINICAL IMPRESSION(S) / ED DIAGNOSES   Final diagnoses:  Laceration of left lower extremity, initial encounter   Rx / DC Orders   ED Discharge Orders          Ordered    cephALEXin  (KEFLEX ) 500 MG capsule  2 times daily        01/12/24 1612           Note:  This document was prepared using Dragon voice recognition software and may include unintentional dictation errors.    Margrette, Vernis Eid A, PA-C 01/12/24 1724    Dorothyann Drivers, MD 01/12/24 [phone]

## 2024-01-12 NOTE — Discharge Instructions (Addendum)
 You were evaluated in the ED for a laceration to your left upper thigh.  You declined the tetanus administration today stating that it was administered 6 years ago.  You will need to return to the ED or follow-up with your primary care provider in 7 to 10 days for suture removal.  Keep sutures dry for first 24 hrs. Keep suture site of clean & dry. Gently use soap & water after first 24 hrs. DO NOT USE alcohol, hydrogen peroxide etc, to clean skin. You may cover the incision with clean gauze & replace it after your daily shower for your comfort.   Take antibiotics as prescribed and until dose is complete.  Pain control:  Ibuprofen (motrin/aleve karolyn) - You can take 3 tablets (600 mg) every 6 hours as needed for pain/fever.  Acetaminophen  (tylenol ) - You can take 2 extra strength tablets (1000 mg) every 6 hours as needed for pain/fever.  You can alternate these medications or take them together.  Make sure you eat food/drink water when taking these medications.

## 2024-01-12 NOTE — ED Notes (Signed)
 Abdominal pad placed over left anterior thigh, bleeding controlled without pressure at this time. Laceration is 6 inches long and appears to extend into the adipose tissue, no muscle or bone visualized.

## 2024-01-12 NOTE — ED Triage Notes (Signed)
 Pt via POV from home. Pt c/o laceration to the L thigh, pt reports he was using skill saw with a new blade. Pt has approx 3-4 inch lac to the L thigh. Bleeding controlled. Reports last tetanus was 2017. Pt is A&Ox4 and NAD, ambulatory to triage

## 2024-01-13 ENCOUNTER — Other Ambulatory Visit: Payer: Self-pay

## 2024-01-14 ENCOUNTER — Other Ambulatory Visit: Payer: Self-pay

## 2024-02-21 ENCOUNTER — Encounter: Payer: 59 | Admitting: Nurse Practitioner

## 2024-02-25 ENCOUNTER — Ambulatory Visit: Payer: Self-pay | Admitting: Nurse Practitioner

## 2024-02-25 ENCOUNTER — Encounter: Payer: Self-pay | Admitting: Nurse Practitioner

## 2024-02-25 VITALS — BP 136/88 | HR 72 | Temp 98.4°F | Resp 16 | Ht 75.0 in | Wt 201.6 lb

## 2024-02-25 DIAGNOSIS — Z125 Encounter for screening for malignant neoplasm of prostate: Secondary | ICD-10-CM

## 2024-02-25 DIAGNOSIS — R7301 Impaired fasting glucose: Secondary | ICD-10-CM

## 2024-02-25 DIAGNOSIS — E782 Mixed hyperlipidemia: Secondary | ICD-10-CM | POA: Diagnosis not present

## 2024-02-25 DIAGNOSIS — Z0001 Encounter for general adult medical examination with abnormal findings: Secondary | ICD-10-CM | POA: Diagnosis not present

## 2024-02-25 NOTE — Progress Notes (Signed)
 [facility] [address] [place], [place] [address]  Internal MEDICINE  Office Visit Note  Patient Name: Kristopher Osborne  [id number]  [id number]  Date of Service: 02/25/2024  Chief Complaint  Patient presents with   Hyperlipidemia   Annual Exam    HPI Hardie presents for an annual well visit and physical exam.  Well-appearing 53 y.o. male with high cholesterol and kidney stones  Routine CRC screening: due in 2027 Labs: due for routine labs  New or worsening pain: none  Other concerns: none  Stopped taking rosuvastatin   --no significant change in diet or lifestyle, tries to exercise a few times a week at home  He has a scar on his left thigh that he sliced open with a saw by accident while cutting wood. He received 9 stitches and he healed very well with no infection.     Current Medication: Outpatient Encounter Medications as of 02/25/2024  Medication Sig   Cyanocobalamin (B-12 PO) Take by mouth.   [DISCONTINUED] rosuvastatin  (CRESTOR ) 5 MG tablet Take 1 tablet (5 mg total) by mouth daily. (Patient not taking: Reported on 02/25/2024)   No facility-administered encounter medications on file as of 02/25/2024.    Surgical History: Past Surgical History:  Procedure Laterality Date   COLONOSCOPY WITH PROPOFOL  N/A 07/19/2020   Procedure: COLONOSCOPY WITH PROPOFOL ;  Surgeon: Unk Corinn Skiff, MD;  Location: [facility] ENDOSCOPY;  Service: Gastroenterology;  Laterality: N/A;   FOOT SURGERY Left 2012    Medical History: Past Medical History:  Diagnosis Date   Hypercholesteremia    PONV (postoperative nausea and vomiting)     Family History: Family History  Problem Relation Age of Onset   Hepatitis C Father    Stroke Maternal Grandmother    Stroke Paternal Grandmother     Social History   Socioeconomic History   Marital status: Married    Spouse name: Verneita    Number of children: 1   Years of education: Not on file   Highest education level: GED or  equivalent  Occupational History   Occupation: Curator   Tobacco Use   Smoking status: Former    Current packs/day: 0.00    Average packs/day: 0.5 packs/day for 7.2 years (3.6 ttl pk-yrs)    Types: Cigarettes    Start date: 05/23/1987    Quit date: 08/01/1994    Years since quitting: 29.5   Smokeless tobacco: Never  Vaping Use   Vaping status: Never Used  Substance and Sexual Activity   Alcohol use: Yes    Alcohol/week: 3.0 standard drinks of alcohol    Types: 3 Cans of beer per week    Comment: 1 night a week    Drug use: No   Sexual activity: Not on file  Other Topics Concern   Not on file  Social History Narrative   Not on file   Social Drivers of Health   Financial Resource Strain: Low Risk  (12/19/2017)   Overall Financial Resource Strain (CARDIA)    Difficulty of Paying Living Expenses: Not hard at all  Food Insecurity: No Food Insecurity (12/19/2017)   Hunger Vital Sign    Worried About Running Out of Food in the Last Year: Never true    Ran Out of Food in the Last Year: Never true  Transportation Needs: No Transportation Needs (12/19/2017)   PRAPARE - Administrator, Civil Service (Medical): No    Lack of Transportation (Non-Medical): No  Physical Activity: Insufficiently Active (12/19/2017)  Exercise Vital Sign    Days of Exercise per Week: 2 days    Minutes of Exercise per Session: 60 min  Stress: No Stress Concern Present (12/19/2017)   [employer] of Occupational Health - Occupational Stress Questionnaire    Feeling of Stress : Not at all  Social Connections: Somewhat Isolated (12/19/2017)   Social Connection and Isolation Panel    Frequency of Communication with Friends and Family: Three times a week    Frequency of Social Gatherings with Friends and Family: Twice a week    Attends Religious Services: Never    Database administrator or Organizations: No    Attends Banker Meetings: Never    Marital Status: Married  Careers information officer Violence: Not At Risk (12/19/2017)   Humiliation, Afraid, Rape, and Kick questionnaire    Fear of Current or Ex-Partner: No    Emotionally Abused: No    Physically Abused: No    Sexually Abused: No      Review of Systems  Constitutional:  Negative for activity change, appetite change, chills, fatigue, fever and unexpected weight change.  HENT: Negative.  Negative for congestion, ear pain, rhinorrhea, sore throat and trouble swallowing.   Eyes: Negative.   Respiratory: Negative.  Negative for cough, chest tightness, shortness of breath and wheezing.   Cardiovascular: Negative.  Negative for chest pain.  Gastrointestinal: Negative.  Negative for abdominal pain, blood in stool, constipation, diarrhea, nausea and vomiting.  Endocrine: Negative.   Genitourinary: Negative.  Negative for difficulty urinating, dysuria, frequency, hematuria and urgency.  Musculoskeletal:  Positive for arthralgias and back pain (mid back pain). Negative for joint swelling, myalgias and neck pain.  Skin: Negative.  Negative for rash and wound.  Allergic/Immunologic: Negative.  Negative for immunocompromised state.  Neurological: Negative.  Negative for dizziness, seizures, numbness and headaches.  Hematological: Negative.   Psychiatric/Behavioral: Negative.  Negative for behavioral problems, self-injury and suicidal ideas. The patient is not nervous/anxious.     Vital Signs: BP 136/88   Pulse 72   Temp 98.4 F (36.9 C)   Resp 16   Ht 6' 3 (1.905 m)   Wt 201 lb 9.6 oz (91.4 kg)   SpO2 97%   BMI 25.20 kg/m    Physical Exam Vitals reviewed.  Constitutional:      General: He is awake. He is not in acute distress.    Appearance: Normal appearance. He is well-developed, well-groomed and overweight. He is not ill-appearing or diaphoretic.  HENT:     Head: Normocephalic and atraumatic.     Right Ear: Tympanic membrane, ear canal and external ear normal.     Left Ear: Tympanic membrane, ear canal  and external ear normal.     Nose: Nose normal. No congestion or rhinorrhea.     Mouth/Throat:     Lips: Pink.     Mouth: Mucous membranes are moist.     Pharynx: Oropharynx is clear. Uvula midline. No oropharyngeal exudate or posterior oropharyngeal erythema.  Eyes:     General: Lids are normal. Vision grossly intact. Gaze aligned appropriately. No scleral icterus.       Right eye: No discharge.        Left eye: No discharge.     Extraocular Movements: Extraocular movements intact.     Conjunctiva/sclera: Conjunctivae normal.     Pupils: Pupils are equal, round, and reactive to light.     Funduscopic exam:    Right eye: Red reflex present.  Left eye: Red reflex present. Neck:     Thyroid : No thyromegaly.     Vascular: No carotid bruit or JVD.     Trachea: Trachea and phonation normal. No tracheal deviation.  Cardiovascular:     Rate and Rhythm: Normal rate and regular rhythm.     Pulses:          Carotid pulses are 3+ on the right side and 3+ on the left side.      Radial pulses are 2+ on the right side and 2+ on the left side.       Posterior tibial pulses are 2+ on the right side and 2+ on the left side.     Heart sounds: Normal heart sounds, S1 normal and S2 normal. No murmur heard.    No friction rub. No gallop.  Pulmonary:     Effort: Pulmonary effort is normal. No accessory muscle usage or respiratory distress.     Breath sounds: Normal breath sounds and air entry. No stridor. No wheezing or rales.  Chest:     Chest wall: No tenderness.  Abdominal:     General: Bowel sounds are normal. There is no distension.     Palpations: Abdomen is soft. There is no mass.     Tenderness: There is no abdominal tenderness. There is no guarding or rebound.  Musculoskeletal:        General: No tenderness or deformity. Normal range of motion.     Cervical back: Normal range of motion and neck supple.     Right lower leg: No edema.     Left lower leg: No edema.  Lymphadenopathy:      Cervical: No cervical adenopathy.  Skin:    General: Skin is warm and dry.     Capillary Refill: Capillary refill takes less than 2 seconds.     Coloration: Skin is not pale.     Findings: No erythema or rash.  Neurological:     Mental Status: He is alert and oriented to person, place, and time.     Cranial Nerves: No cranial nerve deficit.     Motor: No abnormal muscle tone.     Coordination: Coordination normal.     Gait: Gait normal.     Deep Tendon Reflexes: Reflexes are normal and symmetric.  Psychiatric:        Mood and Affect: Mood and affect normal.        Behavior: Behavior normal. Behavior is cooperative.        Thought Content: Thought content normal.        Judgment: Judgment normal.        Assessment/Plan: 1. Encounter for routine adult health examination with abnormal findings (Primary) Age-appropriate preventive screenings and vaccinations discussed, annual physical exam completed. Routine labs for health maintenance ordered, see below. PHM updated.   - CBC with Differential/Platelet - Hgb A1C w/o eAG - CMP14+EGFR - Lipid Profile - PSA Total (Reflex To Free)  2. Mixed hyperlipidemia Routine labs ordered - CBC with Differential/Platelet - Hgb A1C w/o eAG - CMP14+EGFR - Lipid Profile - PSA Total (Reflex To Free)  3. Impaired fasting glucose Routine labs ordered  - CBC with Differential/Platelet - Hgb A1C w/o eAG - CMP14+EGFR - Lipid Profile - PSA Total (Reflex To Free)  4. Screening for prostate cancer Routine lab ordered  - PSA Total (Reflex To Free)      General Counseling: [place] understanding of the findings of todays visit and agrees with plan  of treatment. I have discussed any further diagnostic evaluation that may be needed or ordered today. We also reviewed his medications today. he has been encouraged to call the office with any questions or concerns that should arise related to todays visit.    Orders Placed This  Encounter  Procedures   CBC with Differential/Platelet   Hgb A1C w/o eAG   CMP14+EGFR   Lipid Profile   PSA Total (Reflex To Free)    No orders of the defined types were placed in this encounter.   Return in about 1 year (around 02/24/2025) for CPE, Saralyn Willison PCP and otherwise as needed, will send mychart message regarding lab results. .   Total time spent:30 Minutes Time spent includes review of chart, medications, test results, and follow up plan with the patient.   [place] Controlled Substance Database was reviewed by me.  This patient was seen by Mardy Maxin, FNP-C in collaboration with Dr. Sigrid Bathe as a part of collaborative care agreement.  Yamilka Lopiccolo R. Maxin, MSN, FNP-C Internal medicine

## 2024-03-03 ENCOUNTER — Other Ambulatory Visit
Admission: RE | Admit: 2024-03-03 | Discharge: 2024-03-03 | Disposition: A | Discharge status: Home / Self Care | Attending: Nurse Practitioner | Admitting: Nurse Practitioner

## 2024-03-03 DIAGNOSIS — Z0001 Encounter for general adult medical examination with abnormal findings: Secondary | ICD-10-CM | POA: Insufficient documentation

## 2024-03-03 DIAGNOSIS — Z125 Encounter for screening for malignant neoplasm of prostate: Secondary | ICD-10-CM | POA: Insufficient documentation

## 2024-03-03 DIAGNOSIS — E782 Mixed hyperlipidemia: Secondary | ICD-10-CM | POA: Diagnosis not present

## 2024-03-03 DIAGNOSIS — R7301 Impaired fasting glucose: Secondary | ICD-10-CM | POA: Diagnosis not present

## 2024-03-03 LAB — COMPREHENSIVE METABOLIC PANEL WITH GFR
ALT: 75 U/L — ABNORMAL HIGH (ref 0–44)
AST: 50 U/L — ABNORMAL HIGH (ref 15–41)
Albumin: 4.1 g/dL (ref 3.5–5.0)
Alkaline Phosphatase: 87 U/L (ref 38–126)
Anion gap: 15 (ref 5–15)
BUN: 24 mg/dL — ABNORMAL HIGH (ref 6–20)
CO2: 24 mmol/L (ref 22–32)
Calcium: 9.4 mg/dL (ref 8.9–10.3)
Chloride: 101 mmol/L (ref 98–111)
Creatinine, Ser: 1.1 mg/dL (ref 0.61–1.24)
GFR, Estimated: >60 mL/min (ref >60)
Glucose, Bld: 102 mg/dL — ABNORMAL HIGH (ref 70–99)
Potassium: 4.7 mmol/L (ref 3.5–5.1)
Sodium: 140 mmol/L (ref 135–145)
Total Bilirubin: 0.6 mg/dL (ref 0.0–1.2)
Total Protein: 7.7 g/dL (ref 6.5–8.1)

## 2024-03-03 LAB — CBC WITH DIFFERENTIAL/PLATELET
Abs Immature Granulocytes: 0.02 K/uL (ref 0.00–0.07)
Basophils Absolute: 0 K/uL (ref 0.0–0.1)
Basophils Relative: 1 %
Eosinophils Absolute: 0.4 K/uL (ref 0.0–0.5)
Eosinophils Relative: 8 %
HCT: 43.1 % (ref 39.0–52.0)
Hemoglobin: 13.8 g/dL (ref 13.0–17.0)
Immature Granulocytes: 0 %
Lymphocytes Relative: 29 %
Lymphs Abs: 1.6 K/uL (ref 0.7–4.0)
MCH: 30.8 pg (ref 26.0–34.0)
MCHC: 32 g/dL (ref 30.0–36.0)
MCV: 96.2 fL (ref 80.0–100.0)
Monocytes Absolute: 0.4 K/uL (ref 0.1–1.0)
Monocytes Relative: 7 %
Neutro Abs: 3 K/uL (ref 1.7–7.7)
Neutrophils Relative %: 55 %
Platelets: 261 K/uL (ref 150–400)
RBC: 4.48 MIL/uL (ref 4.22–5.81)
RDW: 12.2 % (ref 11.5–15.5)
WBC: 5.6 K/uL (ref 4.0–10.5)
nRBC: 0 % (ref 0.0–0.2)

## 2024-03-03 LAB — HEMOGLOBIN A1C
Hgb A1c MFr Bld: 5.6 % (ref 4.8–5.6)
Mean Plasma Glucose: 114.02 mg/dL

## 2024-03-03 LAB — LIPID PANEL
Cholesterol: 260 mg/dL — ABNORMAL HIGH (ref 0–200)
HDL: 52 mg/dL (ref >40)
LDL Cholesterol: 192 mg/dL — ABNORMAL HIGH (ref 0–99)
Total CHOL/HDL Ratio: 5 ratio
Triglycerides: 80 mg/dL (ref <150)
VLDL: 16 mg/dL (ref 0–40)

## 2024-03-05 LAB — PSA (REFLEX TO FREE) (SERIAL): Prostate Specific Ag, Serum: 0.6 ng/mL (ref 0.0–4.0)

## 2024-03-12 ENCOUNTER — Ambulatory Visit: Payer: Self-pay | Admitting: Nurse Practitioner

## 2024-03-14 NOTE — Telephone Encounter (Signed)
Left patient a message to give office a callback.Kristopher Osborne

## 2024-03-14 NOTE — Telephone Encounter (Signed)
-----   Message from [place] sent at 03/12/2024 10:51 PM EDT ----- He has some significant abnormal labs that we need to discuss in person. Can we call him and see if he can come in to be seen and discuss his labs?    ----- Message ----- From: Interface, Lab In [place] Sent: 03/03/2024  11:28 AM EDT To: Mardy Maxin, NP

## 2024-03-14 NOTE — Telephone Encounter (Signed)
Patient notified and made an appointment.

## 2024-03-18 ENCOUNTER — Encounter: Payer: Self-pay | Admitting: Nurse Practitioner

## 2024-03-18 ENCOUNTER — Ambulatory Visit: Admitting: Nurse Practitioner

## 2024-03-18 ENCOUNTER — Other Ambulatory Visit: Payer: Self-pay

## 2024-03-18 VITALS — BP 130/82 | HR 72 | Temp 97.3°F | Resp 16 | Ht 75.0 in | Wt 204.0 lb

## 2024-03-18 DIAGNOSIS — E782 Mixed hyperlipidemia: Secondary | ICD-10-CM | POA: Diagnosis not present

## 2024-03-18 DIAGNOSIS — K8020 Calculus of gallbladder without cholecystitis without obstruction: Secondary | ICD-10-CM | POA: Diagnosis not present

## 2024-03-18 DIAGNOSIS — R748 Abnormal levels of other serum enzymes: Secondary | ICD-10-CM | POA: Diagnosis not present

## 2024-03-18 NOTE — Progress Notes (Signed)
 [facility] [address] [place], [place] [address]  Internal MEDICINE  Office Visit Note  Patient Name: Kristopher Osborne  [id number]  [id number]  Date of Service: 03/18/2024  Chief Complaint  Patient presents with   Follow-up    Review labs    HPI Lake presents for a follow-up visit for lab results.  High cholesterol -- LDL 192, total 260 Elevated liver enzymes AST, ALT-- no liver ultrasound has been done. Prediabetes -- current A1c is normal at 5.6 Normal PSA, CBC  The 10-year ASCVD risk score (Arnett DK, et al., 2019) is: 7.5%   Values used to calculate the score:     Age: 53 years     Clincally relevant sex: Male     Is Non-Hispanic African American: No     Diabetic: No     Tobacco smoker: No     Systolic Blood Pressure: 130 mmHg     Is BP treated: No     HDL Cholesterol: 52 mg/dL     Total Cholesterol: 260 mg/dL   Fibrosis 4 Score = 8.77 (Low risk)      Interpretation for patients with NAFLD          <1.30       -  F0-F1 (Low risk)          1.30-2.67 -  Indeterminate           >2.67      -  F3-F4 (High risk)     Validated for ages 43-65           Current Medication: Outpatient Encounter Medications as of 03/18/2024  Medication Sig   Cyanocobalamin (B-12 PO) Take by mouth.   No facility-administered encounter medications on file as of 03/18/2024.    Surgical History: Past Surgical History:  Procedure Laterality Date   COLONOSCOPY WITH PROPOFOL  N/A 07/19/2020   Procedure: COLONOSCOPY WITH PROPOFOL ;  Surgeon: Unk Corinn Skiff, MD;  Location: [facility] ENDOSCOPY;  Service: Gastroenterology;  Laterality: N/A;   FOOT SURGERY Left 2012    Medical History: Past Medical History:  Diagnosis Date   Hypercholesteremia    PONV (postoperative nausea and vomiting)     Family History: Family History  Problem Relation Age of Onset   Hepatitis C Father    Stroke Maternal Grandmother    Stroke Paternal Grandmother     Social History    Socioeconomic History   Marital status: Married    Spouse name: Verneita    Number of children: 1   Years of education: Not on file   Highest education level: GED or equivalent  Occupational History   Occupation: curator   Tobacco Use   Smoking status: Former    Current packs/day: 0.00    Average packs/day: 0.5 packs/day for 7.2 years (3.6 ttl pk-yrs)    Types: Cigarettes    Start date: 05/23/1987    Quit date: 08/01/1994    Years since quitting: 29.6   Smokeless tobacco: Never  Vaping Use   Vaping status: Never Used  Substance and Sexual Activity   Alcohol use: Yes    Alcohol/week: 3.0 standard drinks of alcohol    Types: 3 Cans of beer per week    Comment: 1 night a week    Drug use: No   Sexual activity: Not on file  Other Topics Concern   Not on file  Social History Narrative   Not on file   Social Drivers of Health   Financial Resource Strain:  Low Risk  (12/19/2017)   Overall Financial Resource Strain (CARDIA)    Difficulty of Paying Living Expenses: Not hard at all  Food Insecurity: No Food Insecurity (12/19/2017)   Hunger Vital Sign    Worried About Running Out of Food in the Last Year: Never true    Ran Out of Food in the Last Year: Never true  Transportation Needs: No Transportation Needs (12/19/2017)   PRAPARE - Administrator, Civil Service (Medical): No    Lack of Transportation (Non-Medical): No  Physical Activity: Insufficiently Active (12/19/2017)   Exercise Vital Sign    Days of Exercise per Week: 2 days    Minutes of Exercise per Session: 60 min  Stress: No Stress Concern Present (12/19/2017)   [employer] of Occupational Health - Occupational Stress Questionnaire    Feeling of Stress : Not at all  Social Connections: Somewhat Isolated (12/19/2017)   Social Connection and Isolation Panel    Frequency of Communication with Friends and Family: Three times a week    Frequency of Social Gatherings with Friends and Family: Twice a week     Attends Religious Services: Never    Database Administrator or Organizations: No    Attends Banker Meetings: Never    Marital Status: Married  Catering Manager Violence: Not At Risk (12/19/2017)   Humiliation, Afraid, Rape, and Kick questionnaire    Fear of Current or Ex-Partner: No    Emotionally Abused: No    Physically Abused: No    Sexually Abused: No      Review of Systems  Constitutional:  Negative for chills, fatigue and unexpected weight change.  HENT:  Negative for congestion, postnasal drip, rhinorrhea, sneezing and sore throat.   Eyes:  Negative for redness.  Respiratory: Negative.  Negative for cough, chest tightness, shortness of breath and wheezing.   Cardiovascular: Negative.  Negative for chest pain and palpitations.  Gastrointestinal:  Negative for abdominal pain, constipation, diarrhea, nausea and vomiting.  Genitourinary:  Negative for dysuria and frequency.  Musculoskeletal:  Negative for arthralgias, back pain, joint swelling and neck pain.  Skin:  Negative for rash.  Neurological: Negative.  Negative for tremors and numbness.  Hematological:  Negative for adenopathy. Does not bruise/bleed easily.  Psychiatric/Behavioral:  Negative for behavioral problems (Depression), sleep disturbance and suicidal ideas. The patient is not nervous/anxious.     Vital Signs: BP 130/82   Pulse 72   Temp (!) 97.3 F (36.3 C)   Resp 16   Ht 6' 3 (1.905 m)   Wt 204 lb (92.5 kg)   SpO2 98%   BMI 25.50 kg/m    Physical Exam Vitals reviewed.  Constitutional:      General: He is not in acute distress.    Appearance: Normal appearance. He is not ill-appearing.  HENT:     Head: Normocephalic and atraumatic.  Eyes:     Pupils: Pupils are equal, round, and reactive to light.  Cardiovascular:     Rate and Rhythm: Normal rate and regular rhythm.  Pulmonary:     Effort: Pulmonary effort is normal. No respiratory distress.  Neurological:     Mental Status: He  is alert and oriented to person, place, and time.  Psychiatric:        Mood and Affect: Mood normal.        Behavior: Behavior normal.        Assessment/Plan: 1. Elevated liver enzymes (Primary) Liver ultrasound ordered  - US   Abdomen Limited RUQ (LIVER/GB); Future  2. Calculus of gallbladder without cholecystitis without obstruction Liver ultrasound ordered  - US  Abdomen Limited RUQ (LIVER/GB); Future  3. Mixed hyperlipidemia Declined statin therapy. Will get liver ultrasound and readdress.    General Counseling: johanan skorupski understanding of the findings of todays visit and agrees with plan of treatment. I have discussed any further diagnostic evaluation that may be needed or ordered today. We also reviewed his medications today. he has been encouraged to call the office with any questions or concerns that should arise related to todays visit.    Orders Placed This Encounter  Procedures   US  Abdomen Limited RUQ (LIVER/GB)    No orders of the defined types were placed in this encounter.   Return in about 1 month (around 04/18/2024) for F/U, Makaiah Terwilliger PCP liver ultrasound results. .   Total time spent:30 Minutes Time spent includes review of chart, medications, test results, and follow up plan with the patient.   [place] Controlled Substance Database was reviewed by me.  This patient was seen by Mardy Maxin, FNP-C in collaboration with Dr. Sigrid Bathe as a part of collaborative care agreement.   Kiefer Opheim R. Maxin, MSN, FNP-C Internal medicine

## 2024-03-18 NOTE — Patient Instructions (Signed)
 Add an over the counter supplement for cholesterol -- can be fish oil, krill oil or flaxseed oil. This can be found in [employer] and most other stores.   Review diet information for the liver.   Liver ultrasound is ordered, we will follow up for the results.

## 2024-03-27 ENCOUNTER — Ambulatory Visit
Admission: RE | Admit: 2024-03-27 | Discharge: 2024-03-27 | Disposition: A | Source: Ambulatory Visit | Attending: Nurse Practitioner

## 2024-03-27 DIAGNOSIS — K8020 Calculus of gallbladder without cholecystitis without obstruction: Secondary | ICD-10-CM | POA: Diagnosis not present

## 2024-03-27 DIAGNOSIS — R7989 Other specified abnormal findings of blood chemistry: Secondary | ICD-10-CM | POA: Diagnosis not present

## 2024-03-27 DIAGNOSIS — R748 Abnormal levels of other serum enzymes: Secondary | ICD-10-CM

## 2024-04-10 ENCOUNTER — Ambulatory Visit: Payer: Self-pay | Admitting: Internal Medicine

## 2024-04-11 ENCOUNTER — Encounter: Payer: Self-pay | Admitting: Nurse Practitioner

## 2024-04-16 ENCOUNTER — Encounter: Payer: Self-pay | Admitting: Nurse Practitioner

## 2024-04-16 ENCOUNTER — Ambulatory Visit: Admitting: Nurse Practitioner

## 2024-04-16 VITALS — BP 128/84 | HR 99 | Temp 96.9°F | Resp 16 | Ht 75.0 in | Wt 199.0 lb

## 2024-04-16 DIAGNOSIS — K8020 Calculus of gallbladder without cholecystitis without obstruction: Secondary | ICD-10-CM | POA: Diagnosis not present

## 2024-04-16 DIAGNOSIS — R748 Abnormal levels of other serum enzymes: Secondary | ICD-10-CM

## 2024-04-16 DIAGNOSIS — E782 Mixed hyperlipidemia: Secondary | ICD-10-CM | POA: Diagnosis not present

## 2024-04-16 NOTE — Progress Notes (Signed)
 [facility] [address] [place], [place] [address]  Internal MEDICINE  Office Visit Note  Patient Name: Kristopher Osborne  [id number]  [id number]  Date of Service: 04/16/2024  Chief Complaint  Patient presents with   Hyperlipidemia   Follow-up    Review u/s    HPI Kristopher Osborne presents for a follow-up visit for liver ultrasound results and high cholesterol.  Liver ultrasound -- some gallstones Asymptomatic with gall stones Denies any SOB, activity intolerance, or lower extremity edema.  Elevated liver enzymes on labs but no fatty liver on ultrasound.    Current Medication: Outpatient Encounter Medications as of 04/16/2024  Medication Sig   Cyanocobalamin (B-12 PO) Take by mouth.   No facility-administered encounter medications on file as of 04/16/2024.    Surgical History: Past Surgical History:  Procedure Laterality Date   COLONOSCOPY WITH PROPOFOL  N/A 07/19/2020   Procedure: COLONOSCOPY WITH PROPOFOL ;  Surgeon: Unk Corinn Skiff, MD;  Location: [facility] ENDOSCOPY;  Service: Gastroenterology;  Laterality: N/A;   FOOT SURGERY Left 2012    Medical History: Past Medical History:  Diagnosis Date   Hypercholesteremia    PONV (postoperative nausea and vomiting)     Family History: Family History  Problem Relation Age of Onset   Hepatitis C Father    Stroke Maternal Grandmother    Stroke Paternal Grandmother     Social History   Socioeconomic History   Marital status: Married    Spouse name: Verneita    Number of children: 1   Years of education: Not on file   Highest education level: GED or equivalent  Occupational History   Occupation: curator   Tobacco Use   Smoking status: Former    Current packs/day: 0.00    Average packs/day: 0.5 packs/day for 7.2 years (3.6 ttl pk-yrs)    Types: Cigarettes    Start date: 05/23/1987    Quit date: 08/01/1994    Years since quitting: 29.7   Smokeless tobacco: Never  Vaping Use   Vaping status: Never Used   Substance and Sexual Activity   Alcohol use: Yes    Alcohol/week: 3.0 standard drinks of alcohol    Types: 3 Cans of beer per week    Comment: 1 night a week    Drug use: No   Sexual activity: Not on file  Other Topics Concern   Not on file  Social History Narrative   Not on file   Social Drivers of Health   Financial Resource Strain: Low Risk  (12/19/2017)   Overall Financial Resource Strain (CARDIA)    Difficulty of Paying Living Expenses: Not hard at all  Food Insecurity: No Food Insecurity (12/19/2017)   Hunger Vital Sign    Worried About Running Out of Food in the Last Year: Never true    Ran Out of Food in the Last Year: Never true  Transportation Needs: No Transportation Needs (12/19/2017)   PRAPARE - Administrator, Civil Service (Medical): No    Lack of Transportation (Non-Medical): No  Physical Activity: Insufficiently Active (12/19/2017)   Exercise Vital Sign    Days of Exercise per Week: 2 days    Minutes of Exercise per Session: 60 min  Stress: No Stress Concern Present (12/19/2017)   [employer] of Occupational Health - Occupational Stress Questionnaire    Feeling of Stress : Not at all  Social Connections: Somewhat Isolated (12/19/2017)   Social Connection and Isolation Panel    Frequency of Communication with Friends and  Family: Three times a week    Frequency of Social Gatherings with Friends and Family: Twice a week    Attends Religious Services: Never    Database Administrator or Organizations: No    Attends Banker Meetings: Never    Marital Status: Married  Catering Manager Violence: Not At Risk (12/19/2017)   Humiliation, Afraid, Rape, and Kick questionnaire    Fear of Current or Ex-Partner: No    Emotionally Abused: No    Physically Abused: No    Sexually Abused: No      Review of Systems  Constitutional:  Negative for chills, fatigue and unexpected weight change.  HENT:  Negative for congestion, postnasal drip,  rhinorrhea, sneezing and sore throat.   Eyes:  Negative for redness.  Respiratory: Negative.  Negative for cough, chest tightness, shortness of breath and wheezing.   Cardiovascular: Negative.  Negative for chest pain and palpitations.  Gastrointestinal:  Negative for abdominal pain, constipation, diarrhea, nausea and vomiting.  Genitourinary:  Negative for dysuria and frequency.  Musculoskeletal:  Negative for arthralgias, back pain, joint swelling and neck pain.  Skin:  Negative for rash.  Neurological: Negative.  Negative for tremors and numbness.  Hematological:  Negative for adenopathy. Does not bruise/bleed easily.  Psychiatric/Behavioral:  Negative for behavioral problems (Depression), sleep disturbance and suicidal ideas. The patient is not nervous/anxious.     Vital Signs: BP 128/84   Pulse 99   Temp (!) 96.9 F (36.1 C)   Resp 16   Ht 6' 3 (1.905 m)   Wt 199 lb (90.3 kg)   SpO2 99%   BMI 24.87 kg/m    Physical Exam Vitals reviewed.  Constitutional:      General: He is not in acute distress.    Appearance: Normal appearance. He is not ill-appearing.  HENT:     Head: Normocephalic and atraumatic.  Eyes:     Pupils: Pupils are equal, round, and reactive to light.  Cardiovascular:     Rate and Rhythm: Normal rate and regular rhythm.  Pulmonary:     Effort: Pulmonary effort is normal. No respiratory distress.  Neurological:     Mental Status: He is alert and oriented to person, place, and time.  Psychiatric:        Mood and Affect: Mood normal.        Behavior: Behavior normal.        Assessment/Plan: 1. Elevated liver enzymes (Primary) Repeat labs ordered  - CMP14+EGFR - Lipid Profile  2. Calculus of gallbladder without cholecystitis without obstruction Repeat labs ordered  - CMP14+EGFR - Lipid Profile  3. Mixed hyperlipidemia Repeat labs ordered  - CMP14+EGFR - Lipid Profile   General Counseling: Kristopher Osborne verbalizes understanding of the  findings of todays visit and agrees with plan of treatment. I have discussed any further diagnostic evaluation that may be needed or ordered today. We also reviewed his medications today. he has been encouraged to call the office with any questions or concerns that should arise related to todays visit.    Orders Placed This Encounter  Procedures   CMP14+EGFR   Lipid Profile    No orders of the defined types were placed in this encounter.   Return in about 6 months (around 10/14/2024) for F/U, Iliyah Bui PCP, have labs done before visit. .   Total time spent:30 Minutes Time spent includes review of chart, medications, test results, and follow up plan with the patient.   [place] Controlled Substance Database was reviewed  by me.  This patient was seen by Mardy Maxin, FNP-C in collaboration with Dr. Sigrid Bathe as a part of collaborative care agreement.   Shila Kruczek R. Maxin, MSN, FNP-C Internal medicine

## 2024-05-21 ENCOUNTER — Encounter: Payer: Self-pay | Admitting: Nurse Practitioner

## 2024-10-24 ENCOUNTER — Ambulatory Visit: Admitting: Nurse Practitioner

## 2025-02-25 ENCOUNTER — Encounter: Admitting: Nurse Practitioner
